# Patient Record
Sex: Male | Born: 2000
Health system: Southern US, Community
[De-identification: ages and names within clinical notes are randomized; demographics above are authoritative.]

## PROBLEM LIST (undated history)

## (undated) DIAGNOSIS — F419 Anxiety disorder, unspecified: Secondary | ICD-10-CM

## (undated) DIAGNOSIS — F32A Depression, unspecified: Secondary | ICD-10-CM

## (undated) DIAGNOSIS — J45909 Unspecified asthma, uncomplicated: Secondary | ICD-10-CM

## (undated) DIAGNOSIS — F329 Major depressive disorder, single episode, unspecified: Secondary | ICD-10-CM

## (undated) HISTORY — DX: Depression, unspecified: F32.A

## (undated) HISTORY — DX: Major depressive disorder, single episode, unspecified: F32.9

## (undated) HISTORY — PX: NO PAST SURGERIES: SHX2092

## (undated) HISTORY — PX: FRACTURE SURGERY: SHX138

---
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
## 2020-01-09 ENCOUNTER — Ambulatory Visit: Admitting: Family Medicine

## 2020-03-04 ENCOUNTER — Other Ambulatory Visit: Payer: Self-pay

## 2020-03-04 ENCOUNTER — Ambulatory Visit: Attending: Family | Admitting: Family

## 2020-03-04 ENCOUNTER — Encounter: Payer: Self-pay | Admitting: Family

## 2020-03-04 VITALS — BP 124/82 | HR 61 | Temp 98.2°F | Resp 16 | Wt 145.8 lb

## 2020-03-04 DIAGNOSIS — H938X1 Other specified disorders of right ear: Secondary | ICD-10-CM | POA: Diagnosis not present

## 2020-03-04 NOTE — Progress Notes (Signed)
Patient ID: Shawn [name], male    DOB: 05-Feb-2001  MRN: [id number]  CC: RIGHT EAR CONCERN  Subjective: Shawn [name] is a 19 y.o. male with history of asthma.  His concerns today include: right ear concern  1. RIGHT EAR CONCERN:  Location: right ear Description: not painful but does feel some fullness  Onset: 3 weeks ago while taking a shower and washing his hair reports water went into his right ear Modifying factors: tried hydrogen peroxide but didn't help also using Qtips to clean ears but did not help   Symptoms   Sensation of fullness: yes Ear discharge: denies URI symptoms:  denies Fever: denies Tinnitus:  denies Dizziness:  denies Headaches: denies Hearing loss:  Yes initially had decreased hearing. Reports on yesterday hearing improved some but not completely back to baseline. Sensation of foreign body in ear: denies Popping or clicking sounds: denies Toothache: denies Rashes or lesions: denies  Facial muscle weakness: denies  Red Flags Recent trauma: denies PMH prior ear surgery: denies   Diabetes or Immunosuppresion: denies     Patient Active Problem List   Diagnosis Date Noted  . Asthma 12/30/2019     Current Outpatient Medications on File Prior to Visit  Medication Sig Dispense Refill  . albuterol (PROVENTIL HFA;VENTOLIN HFA) 108 (90 BASE) MCG/ACT inhaler Inhale 2 puffs into the lungs every 6 (six) hours as needed for wheezing or shortness of breath.    Marland Kitchen albuterol (VENTOLIN HFA) 108 (90 Base) MCG/ACT inhaler Inhale 1-2 puffs into the lungs every 6 (six) hours as needed for wheezing or shortness of breath. 6.7 g 0  . fluticasone (FLONASE) 50 MCG/ACT nasal spray Place 1 spray into both nostrils daily as needed for allergies or rhinitis.    . fluticasone (FLOVENT HFA) 44 MCG/ACT inhaler Inhale 2 puffs into the lungs 2 (two) times daily. 1 Inhaler 3  . hydrOXYzine (ATARAX/VISTARIL) 10 MG tablet Take 1-2 up to 3 times/day as needed for anxiety (Patient not  taking: Reported on 12/28/2019) 90 tablet 1  . methocarbamol (ROBAXIN) 500 MG tablet Take 1 tablet (500 mg total) by mouth every 6 (six) hours as needed for muscle spasms. 30 tablet 2  . predniSONE (DELTASONE) 20 MG tablet Take 2 tablets (40 mg total) by mouth daily. (Patient not taking: Reported on 12/28/2019) 10 tablet 0  . venlafaxine XR (EFFEXOR-XR) 37.5 MG 24 hr capsule Take one each morning after breakfast for 1 week, then increase to 2 each morning (Patient not taking: Reported on 12/28/2019) 60 capsule 1   No current facility-administered medications on file prior to visit.    No Known Allergies  Social History   Socioeconomic History  . Marital status: Single    Spouse name: Not on file  . Number of children: Not on file  . Years of education: Not on file  . Highest education level: Not on file  Occupational History  . Not on file  Tobacco Use  . Smoking status: Never Smoker  . Smokeless tobacco: Never Used  Substance and Sexual Activity  . Alcohol use: No  . Drug use: No  . Sexual activity: Never  Other Topics Concern  . Not on file  Social History Narrative  . Not on file   Social Determinants of Health   Financial Resource Strain:   . Difficulty of Paying Living Expenses:   Food Insecurity:   . Worried About Programme researcher, broadcasting/film/video in the Last Year:   . [employer]  in the Last Year:   Transportation Needs:   . Film/video editor (Medical):   Marland Kitchen Lack of Transportation (Non-Medical):   Physical Activity:   . Days of Exercise per Week:   . Minutes of Exercise per Session:   Stress:   . Feeling of Stress :   Social Connections:   . Frequency of Communication with Friends and Family:   . Frequency of Social Gatherings with Friends and Family:   . Attends Religious Services:   . Active Member of Clubs or Organizations:   . Attends Archivist Meetings:   Marland Kitchen Marital Status:   Intimate Partner Violence:   . Fear of Current or Ex-Partner:   .  Emotionally Abused:   Marland Kitchen Physically Abused:   . Sexually Abused:     Family History  Problem Relation Age of Onset  . Asthma Mother   . Hypertension Father     Past Surgical History:  Procedure Laterality Date  . NO PAST SURGERIES      ROS: Review of Systems Negative except as stated above  PHYSICAL EXAM: Vitals with BMI 03/04/2020 12/28/2019 09/22/2019  Height - 6\' 2"  -  Weight 145 lbs 13 oz 148 lbs -  BMI - 95.09 -  Systolic 326 712 458  Diastolic 82 71 73  Pulse 61 77 62  Some encounter information is confidential and restricted. Go to Review Flowsheets activity to see all data.  SpO2- 97%, room air Temperature- 98.2 F  Physical Exam General appearance - alert, well appearing, and in no distress and oriented to person, place, and time Eyes - pupils equal and reactive, extraocular eye movements intact Ears - bilateral TM's and external ear canals normal Nose - normal and patent, no erythema, discharge or polyps and normal nontender sinuses Mouth - mucous membranes moist, pharynx normal without lesions Neck - supple, no significant adenopathy Lymphatics - no palpable lymphadenopathy, no hepatosplenomegaly Chest - clear to auscultation, no wheezes, rales or rhonchi, symmetric air entry, no tachypnea, retractions or cyanosis Heart - normal rate, regular rhythm, normal S1, S2, no murmurs, rubs, clicks or gallops Neurological - alert, oriented, normal speech, no focal findings or movement disorder noted, neck supple without rigidity, cranial nerves II through XII intact, funduscopic exam normal, discs flat and sharp, DTR's normal and symmetric, motor and sensory grossly normal bilaterally, normal muscle tone, no tremors, strength 5/5, Romberg sign negative, normal gait and station  CMP Latest Ref Rng & Units 04/12/2017 05/31/2016 02/28/2015  Glucose 65 - 99 mg/dL 164(H) 87 108(H)  BUN 6 - 20 mg/dL 8 6 6   Creatinine 0.50 - 1.00 mg/dL 1.05(H) 0.90 0.68  Sodium 135 - 145 mmol/L 135  141 140  Potassium 3.5 - 5.1 mmol/L 3.3(L) 3.8 4.1  Chloride 101 - 111 mmol/L 101 100(L) 106  CO2 22 - 32 mmol/L 23 - 26  Calcium 8.9 - 10.3 mg/dL 9.4 - 9.6  Total Protein 6.5 - 8.1 g/dL 7.1 - 7.4  Total Bilirubin 0.3 - 1.2 mg/dL 0.7 - 0.7  Alkaline Phos 74 - 390 U/L 129 - 351  AST 15 - 41 U/L 22 - 25  ALT 17 - 63 U/L 12(L) - 14   Lipid Panel  No results found for: CHOL, TRIG, HDL, CHOLHDL, VLDL, LDLCALC, LDLDIRECT  CBC    Component Value Date/Time   WBC 5.3 02/28/2015 1840   RBC 4.93 02/28/2015 1840   HGB 15.0 (H) 05/31/2016 1850   HCT 44.0 05/31/2016 1850   PLT 206  02/28/2015 1840   MCV 86.4 02/28/2015 1840   MCH 29.0 02/28/2015 1840   MCHC 33.6 02/28/2015 1840   RDW 12.8 02/28/2015 1840   LYMPHSABS 2.8 02/28/2015 1840   MONOABS 0.6 02/28/2015 1840   EOSABS 0.1 02/28/2015 1840   BASOSABS 0.0 02/28/2015 1840    ASSESSMENT AND PLAN: 1. Sensation of fullness in right ear: -Sensation of fullness most likely related to getting water in ear. Exam unrevealing. -Continue to monitor symptoms of right ear. If symptoms do not improve or become worse notify primary doctor. If symptoms become severe seek medical assistance immediately.  Patient was given the opportunity to ask questions.  Patient verbalized understanding of the plan and was able to repeat key elements of the plan.    Requested Prescriptions    No prescriptions requested or ordered in this encounter    Kaylenn Civil Jodi Geralds, NP

## 2020-03-04 NOTE — Patient Instructions (Signed)
Continue to monitor symptoms. Notify provider if symptoms worsen or do not improve. Seek emergency assistance if symptoms become severe. Earache, Adult An earache, or ear pain, can be caused by many things, including:  An infection.  Ear wax buildup.  Ear pressure.  Something in the ear that should not be there (foreign body).  A sore throat.  Tooth problems.  Jaw problems. Treatment of the earache will depend on the cause. If the cause is not clear or cannot be determined, you may need to watch your symptoms until your earache goes away or until a cause is found. Follow these instructions at home: Medicines  Take or apply over-the-counter and prescription medicines only as told by your health care provider.  If you were prescribed an antibiotic medicine, use it as told by your health care provider. Do not stop using the antibiotic even if you start to feel better.  Do not put anything in your ear other than medicine that is prescribed by your health care provider. Managing pain If directed, apply heat to the affected area as often as told by your health care provider. Use the heat source that your health care provider recommends, such as a moist heat pack or a heating pad.  Place a towel between your skin and the heat source.  Leave the heat on for 20-30 minutes.  Remove the heat if your skin turns bright red. This is especially important if you are unable to feel pain, heat, or cold. You may have a greater risk of getting burned. If directed, put ice on the affected area as often as told by your health care provider. To do this:      Put ice in a plastic bag.  Place a towel between your skin and the bag.  Leave the ice on for 20 minutes, 2-3 times a day. General instructions  Pay attention to any changes in your symptoms.  Try resting in an upright position instead of lying down. This may help to reduce pressure in your ear and relieve pain.  Chew gum if it helps  to relieve your ear pain.  Treat any allergies as told by your health care provider.  Drink enough fluid to keep your urine pale yellow.  It is up to you to get the results of any tests that were done. Ask your health care provider, or the department that is doing the tests, when your results will be ready.  Keep all follow-up visits as told by your health care provider. This is important. Contact a health care provider if:  Your pain does not improve within 2 days.  Your earache gets worse.  You have new symptoms.  You have a fever. Get help right away if you:  Have a severe headache.  Have a stiff neck.  Have trouble swallowing.  Have redness or swelling behind your ear.  Have fluid or blood coming from your ear.  Have hearing loss.  Feel dizzy. Summary  An earache, or ear pain, can be caused by many things.  Treatment of the earache will depend on the cause. Follow recommendations from your health care provider to treat your ear pain.  If the cause is not clear or cannot be determined, you may need to watch your symptoms until your earache goes away or until a cause is found.  Keep all follow-up visits as told by your health care provider. This is important. This information is not intended to replace advice given to you by  your health care provider. Make sure you discuss any questions you have with your health care provider. Document Revised: 06/23/2019 Document Reviewed: 06/23/2019 Elsevier Patient Education  [employer].

## 2020-06-18 ENCOUNTER — Telehealth: Payer: Self-pay

## 2020-06-18 ENCOUNTER — Ambulatory Visit: Payer: Self-pay | Admitting: *Deleted

## 2020-06-18 NOTE — Telephone Encounter (Signed)
Summary: Vaccine question   Patient's mother requesting to speak with RN to discuss whether to wait to get covid-19 vaccine before or after tooth extraction.      Second attempted to reach caller. Left message on voice mail to call back.

## 2020-06-18 NOTE — Telephone Encounter (Signed)
Summary: Vaccine question   Patient's mother requesting to speak with RN to discuss whether to wait to get covid-19 vaccine before or after tooth extraction.      Attempted to reach mother x 3. Unsuccessful.

## 2020-06-18 NOTE — Telephone Encounter (Signed)
Mom asking if pt. Can have COVID 19 vaccine prior to tooth extraction. Reports he is not sick, has not had any recent immunizations. Instructed her he should be fine to have vaccination.

## 2020-06-23 ENCOUNTER — Ambulatory Visit: Attending: Internal Medicine

## 2020-06-23 DIAGNOSIS — Z23 Encounter for immunization: Secondary | ICD-10-CM

## 2020-06-23 NOTE — Progress Notes (Signed)
   Covid-19 Vaccination Clinic  Name:  Shawn [name]    MRN: [id number] DOB: Apr 08, 2001  06/23/2020  Mr. [name] was observed post Covid-19 immunization for 15 minutes without incident. He was provided with Vaccine Information Sheet and instruction to access the V-Safe system.   Mr. [name] was instructed to call 911 with any severe reactions post vaccine: Marland Kitchen Difficulty breathing  . Swelling of face and throat  . A fast heartbeat  . A bad rash all over body  . Dizziness and weakness   Immunizations Administered    Name Date Dose VIS Date Route   Pfizer COVID-19 Vaccine 06/23/2020 11:24 AM 0.3 mL 01/23/2019 Intramuscular   Manufacturer: [employer], [employer]   Lot: [id number]   NDC: [address]-2

## 2020-07-14 ENCOUNTER — Ambulatory Visit: Attending: Internal Medicine

## 2020-07-14 DIAGNOSIS — Z23 Encounter for immunization: Secondary | ICD-10-CM

## 2020-07-14 NOTE — Progress Notes (Signed)
   Covid-19 Vaccination Clinic  Name:  Shawn [name]    MRN: [id number] DOB: Apr 24, 2001  07/14/2020  Mr. [name] was observed post Covid-19 immunization for 15 minutes without incident. He was provided with Vaccine Information Sheet and instruction to access the V-Safe system.   Mr. [name] was instructed to call 911 with any severe reactions post vaccine: Marland Kitchen Difficulty breathing  . Swelling of face and throat  . A fast heartbeat  . A bad rash all over body  . Dizziness and weakness   Immunizations Administered    Name Date Dose VIS Date Route   Pfizer COVID-19 Vaccine 07/14/2020 10:38 AM 0.3 mL 01/23/2019 Intramuscular   Manufacturer: [employer], [employer]   Lot: [id number]   NDC: [address]-9

## 2020-12-10 ENCOUNTER — Ambulatory Visit: Attending: Family Medicine | Admitting: Pharmacist

## 2020-12-10 ENCOUNTER — Other Ambulatory Visit: Payer: Self-pay

## 2020-12-10 ENCOUNTER — Ambulatory Visit: Attending: Physician Assistant | Admitting: Physician Assistant

## 2020-12-10 ENCOUNTER — Encounter: Payer: Self-pay | Admitting: Physician Assistant

## 2020-12-10 VITALS — BP 110/72 | HR 74 | Temp 98.2°F | Resp 16 | Ht 74.0 in | Wt 148.0 lb

## 2020-12-10 DIAGNOSIS — R251 Tremor, unspecified: Secondary | ICD-10-CM

## 2020-12-10 DIAGNOSIS — Z8659 Personal history of other mental and behavioral disorders: Secondary | ICD-10-CM | POA: Diagnosis not present

## 2020-12-10 DIAGNOSIS — M62838 Other muscle spasm: Secondary | ICD-10-CM

## 2020-12-10 DIAGNOSIS — Z23 Encounter for immunization: Secondary | ICD-10-CM

## 2020-12-10 MED ORDER — METHOCARBAMOL 500 MG PO TABS: 1000.0000 mg | ORAL_TABLET | Freq: Three times a day (TID) | ORAL | 2 refills | Status: DC | PRN

## 2020-12-10 MED ORDER — MELOXICAM 15 MG PO TABS: 15.0000 mg | ORAL_TABLET | Freq: Every day | ORAL | 0 refills | Status: DC

## 2020-12-10 NOTE — Progress Notes (Signed)
Patient ID: Shawn [name], male   DOB: [birth year], 20 y.o.   MRN: [id number]   Shawn [name], is a 20 y.o. male  VZD:[id number]  [id number]:[id number]  DOB - [birth year]  Subjective:  Chief Complaint and HPI: Shawn [name] is a 20 y.o. male here today for concerns that he wants to be referred to a neurologist for.  He has neck pain and muscle spasms that are ongoing for about 2 years.  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  No known injury.  Says occasionally he can't move either of his arms.  His says his therapist wants him to see a neurologist and he is set on this idea.  Also says he has had involuntary facial tics on and off for a couple of years.  Also gets shaky/has tremors in crowds.  He does not want to take anxiety meds but he has been prescribed them in the past.        ROS:   Constitutional:  No f/c, No night sweats, No unexplained weight loss. EENT:  No vision changes, No blurry vision, No hearing changes. No mouth, throat, or ear problems.  Respiratory: No cough, No SOB Cardiac: No CP, no palpitations GI:  No abd pain, No N/V/D. GU: No Urinary s/sx Musculoskeletal: see above Neuro: No headache, no dizziness, no motor weakness.  Skin: No rash Endocrine:  No polydipsia. No polyuria.  Psych: Denies SI/HI  No problems updated.  ALLERGIES: No Known Allergies  PAST MEDICAL HISTORY: Past Medical History:  Diagnosis Date  . Anxiety   . Asthma   . Depression     MEDICATIONS AT HOME: Prior to Admission medications   Medication Sig Start Date End Date Taking? Authorizing Provider  meloxicam (MOBIC) 15 MG tablet Take 1 tablet (15 mg total) by mouth daily. 12/10/20  Yes Antron Seth, Marzella Schlein, PA-C  albuterol (PROVENTIL HFA;VENTOLIN HFA) 108 (90 BASE) MCG/ACT inhaler Inhale 2 puffs into the lungs every 6 (six) hours as needed for wheezing or shortness of breath.    [provider]  albuterol (VENTOLIN HFA) 108 (90 Base) MCG/ACT inhaler  Inhale 1-2 puffs into the lungs every 6 (six) hours as needed for wheezing or shortness of breath. 09/22/19   Bethel Born, PA-C  fluticasone (FLONASE) 50 MCG/ACT nasal spray Place 1 spray into both nostrils daily as needed for allergies or rhinitis. Patient not taking: Reported on 12/10/2020    [provider]  fluticasone (FLOVENT HFA) 44 MCG/ACT inhaler Inhale 2 puffs into the lungs 2 (two) times daily. Patient not taking: Reported on 12/10/2020 12/31/19   Cain Saupe, MD  hydrOXYzine (ATARAX/VISTARIL) 10 MG tablet Take 1-2 up to 3 times/day as needed for anxiety Patient not taking: Reported on 12/28/2019 06/22/19   Gentry Fitz, MD  methocarbamol (ROBAXIN) 500 MG tablet Take 2 tablets (1,000 mg total) by mouth every 8 (eight) hours as needed for muscle spasms. 12/10/20   Anders Simmonds, PA-C  venlafaxine XR (EFFEXOR-XR) 37.5 MG 24 hr capsule Take one each morning after breakfast for 1 week, then increase to 2 each morning Patient not taking: Reported on 12/28/2019 06/22/19   Gentry Fitz, MD     Objective:  EXAM:   Vitals:   12/10/20 1134  BP: 110/72  Pulse: 74  Resp: 16  Temp: 98.2 F (36.8 C)  SpO2: 97%  Weight: 148 lb (67.1 kg)  Height: 6\' 2"  (1.88 m)    General appearance : A&OX3.  NAD. Non-toxic-appearing HEENT: Atraumatic and Normocephalic.  PERRLA. EOM intact.  Neck-no bony TTP; full S&ROM.  UE DTR=intact B.  There is spasm B in the upper trapezius Neck: supple, no JVD. No cervical lymphadenopathy. No thyromegaly Chest/Lungs:  Breathing-non-labored, Good air entry bilaterally, breath sounds normal without rales, rhonchi, or wheezing  CVS: S1 S2 regular, no murmurs, gallops, rubs  Extremities: Bilateral Lower Ext shows no edema, both legs are warm to touch with = pulse throughout Neurology:  CN II-XII grossly intact, Non focal.   Psych:  TP linear. J/I fair. Normal speech. Appropriate eye contact and blunted affect.  Skin:  No Rash  Data Review No results  found for: HGBA1C   Assessment & Plan   1. Muscle spasms of neck - methocarbamol (ROBAXIN) 500 MG tablet; Take 2 tablets (1,000 mg total) by mouth every 8 (eight) hours as needed for muscle spasms.  Dispense: 90 tablet; Refill: 2 - Ambulatory referral to Neurology - meloxicam (MOBIC) 15 MG tablet; Take 1 tablet (15 mg total) by mouth daily.  Dispense: 30 tablet; Refill: 0  2. H/O tics - Ambulatory referral to Neurology  3. Tremor - Ambulatory referral to Neurology   Patient have been counseled extensively about nutrition and exercise  Return in about 3 months (around 03/10/2021) for assign PCP.  The patient was given clear instructions to go to ER or return to medical center if symptoms don't improve, worsen or new problems develop. The patient verbalized understanding. The patient was told to call to get lab results if they haven't heard anything in the next week.     Georgian Co, PA-C [facility] [place], [place] [phone]   12/10/2020, 11:53 AM

## 2020-12-10 NOTE — Progress Notes (Signed)
Patient presents for vaccination against influenza per orders of Georgian Co. Consent given. Counseling provided. No contraindications exists. Vaccine administered without incident.   Fabio Neighbors, PharmD, BCPS PGY2 Ambulatory Care Resident [facility]

## 2020-12-19 ENCOUNTER — Other Ambulatory Visit

## 2020-12-19 DIAGNOSIS — Z20822 Contact with and (suspected) exposure to covid-19: Secondary | ICD-10-CM

## 2020-12-21 LAB — NOVEL CORONAVIRUS, NAA: SARS-CoV-2, NAA: NOT DETECTED

## 2020-12-21 LAB — SARS-COV-2, NAA 2 DAY TAT

## 2021-03-10 ENCOUNTER — Ambulatory Visit: Attending: Internal Medicine | Admitting: Internal Medicine

## 2021-03-10 ENCOUNTER — Other Ambulatory Visit: Payer: Self-pay

## 2021-03-10 DIAGNOSIS — J4520 Mild intermittent asthma, uncomplicated: Secondary | ICD-10-CM

## 2021-03-10 DIAGNOSIS — J302 Other seasonal allergic rhinitis: Secondary | ICD-10-CM

## 2021-03-10 MED ORDER — ALBUTEROL SULFATE HFA 108 (90 BASE) MCG/ACT IN AERS: 1.0000 | INHALATION_SPRAY | Freq: Four times a day (QID) | RESPIRATORY_TRACT | 5 refills | Status: DC | PRN

## 2021-03-10 MED ORDER — FLUTICASONE PROPIONATE 50 MCG/ACT NA SUSP: 1.0000 | Freq: Every day | NASAL | 3 refills | Status: DC | PRN

## 2021-03-10 NOTE — Progress Notes (Signed)
Virtual Visit via Telephone Note  I connected with Shawn [name] on 03/10/2021 at 3:40 p.m by telephone and verified that I am speaking with the correct person using two identifiers  Location: Patient: home Provider: office  Participants: Myself Patient CMA: Ms. Reginia Forts interpreter:   I discussed the limitations, risks, security and privacy concerns of performing an evaluation and management service by telephone and the availability of in person appointments. I also discussed with the patient that there may be a patient responsible charge related to this service. The patient expressed understanding and agreed to proceed.   History of Present Illness: She has history of mild intermittent asthma, neck spasm, GAD.  Last PCP is Dr. Jillyn Hidden who is no longer with the practice.  Asthma: Overall he reports he is doing well.  No complaints today.  No shortness of breath or cough.  Needing RF on Albuterol.  Only on this, not Flovent.  Uses albuterol 1-2 x/wk. Asthma flares during allergy season and uses Flonase to help reduce the symptoms.  Requests refills on this also.   Outpatient Encounter Medications as of 03/10/2021  Medication Sig  . albuterol (PROVENTIL HFA;VENTOLIN HFA) 108 (90 BASE) MCG/ACT inhaler Inhale 2 puffs into the lungs every 6 (six) hours as needed for wheezing or shortness of breath.  Marland Kitchen albuterol (VENTOLIN HFA) 108 (90 Base) MCG/ACT inhaler Inhale 1-2 puffs into the lungs every 6 (six) hours as needed for wheezing or shortness of breath.  . fluticasone (FLONASE) 50 MCG/ACT nasal spray Place 1 spray into both nostrils daily as needed for allergies or rhinitis. (Patient not taking: Reported on 12/10/2020)  . fluticasone (FLOVENT HFA) 44 MCG/ACT inhaler Inhale 2 puffs into the lungs 2 (two) times daily. (Patient not taking: Reported on 12/10/2020)  . hydrOXYzine (ATARAX/VISTARIL) 10 MG tablet Take 1-2 up to 3 times/day as needed for anxiety (Patient not taking: Reported on  12/28/2019)  . meloxicam (MOBIC) 15 MG tablet Take 1 tablet (15 mg total) by mouth daily.  . methocarbamol (ROBAXIN) 500 MG tablet Take 2 tablets (1,000 mg total) by mouth every 8 (eight) hours as needed for muscle spasms.  Marland Kitchen venlafaxine XR (EFFEXOR-XR) 37.5 MG 24 hr capsule Take one each morning after breakfast for 1 week, then increase to 2 each morning (Patient not taking: Reported on 12/28/2019)   No facility-administered encounter medications on file as of 03/10/2021.      Observations/Objective: No direct observation done as this was a telephone encounter.  Assessment and Plan: 1. Mild intermittent asthma without complication Stable.  Refill albuterol to use as needed. - albuterol (VENTOLIN HFA) 108 (90 Base) MCG/ACT inhaler; Inhale 1-2 puffs into the lungs every 6 (six) hours as needed for wheezing or shortness of breath.  Dispense: 6.7 g; Refill: 5  2. Seasonal allergies Refill Flonase to use as needed. - fluticasone (FLONASE) 50 MCG/ACT nasal spray; Place 1 spray into both nostrils daily as needed for allergies or rhinitis.  Dispense: 9.9 mL; Refill: 3   Follow Up Instructions: As needed   I discussed the assessment and treatment plan with the patient. The patient was provided an opportunity to ask questions and all were answered. The patient agreed with the plan and demonstrated an understanding of the instructions.   The patient was advised to call back or seek an in-person evaluation if the symptoms worsen or if the condition fails to improve as anticipated.  I  Spent 6 minutes on this telephone encounter  Jonah Blue, MD

## 2021-03-27 ENCOUNTER — Encounter: Payer: Self-pay | Admitting: Neurology

## 2021-03-27 ENCOUNTER — Ambulatory Visit (INDEPENDENT_AMBULATORY_CARE_PROVIDER_SITE_OTHER): Admitting: Neurology

## 2021-03-27 VITALS — BP 112/65 | HR 69 | Ht 74.0 in | Wt 142.0 lb

## 2021-03-27 DIAGNOSIS — F418 Other specified anxiety disorders: Secondary | ICD-10-CM

## 2021-03-27 DIAGNOSIS — G2569 Other tics of organic origin: Secondary | ICD-10-CM | POA: Insufficient documentation

## 2021-03-27 MED ORDER — ARIPIPRAZOLE 2 MG PO TABS: 4.0000 mg | ORAL_TABLET | Freq: Every day | ORAL | 6 refills | Status: DC

## 2021-03-27 NOTE — Progress Notes (Signed)
Chief Complaint  Patient presents with  . New Patient (Initial Visit)    New rm with father- Pt reports he is here to discuss muscle spasms of the neck, tremors and h/x of ticks. Also reports hx of anxiety. Reports the muscles spasm/ticks are triggered in large crowds.Ticks are located in his neck primarily-          ASSESSMENT AND PLAN  Shawn [name] is a 20 y.o. male   Depression anxiety Motor tics  Laboratory evaluation to rule out treatable etiology including thyroid functional test  Start Abilify 2 mg every night, may titrating to 4 mg every night   DIAGNOSTIC DATA (LABS, IMAGING, TESTING) - I reviewed patient records, labs, notes, testing and imaging myself where available. MRI of the brain without contrast was normal in 2017 Laboratory evaluation in May 2018, UDS was positive for marijuana, normal CMP,  HISTORICAL  Shawn [name] is a 20 year old male, seen in request by his PA Anders Simmonds, for evaluation of tics, initial evaluation was on March 27, 2021  I reviewed and summarized the referring note.  He reported long history of anxiety, began to seek treatment since high school, he reported social phobia, increased anxiety in front of people, heart beating fast, sweat, shaky, he tried to Zoloft 25 mg, Prozac 10 mg each only for short period of time, describes fatigue, lack of energy, no longer taking it  He is currently a Archivist, major in piano, since his senior year at high school, concurrent with his worsening depression anxiety, he began to have motor tics, frequent neck jerking movement, especially in crowds, he has to skip his high school graduation despite to being on the honor roll, because he worries about sitting in the crowd, working in the stage,  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, because he has frequent neck jerking movement in front of his professor, and in band practice, it is difficult for him to finish his performance and  practice  He sleeps well most of the time, has irregular eating habit, few days ago he had panic attack, driving through fast food, could not find his inhaler, began to have difficulty breathing, heart racing fast, he was able to manage driving back home, his anxiety panic episode gradually settled,  He is currently seeing psychotherapy,  Personally reviewed MRI of the brain in 2017 was normal, previous UDS was positive for marijuana  REVIEW OF SYSTEMS:  Full 14 system review of systems performed and notable only for as above All other review of systems were negative.  PHYSICAL EXAM:   Vitals:   03/27/21 1020  BP: 112/65  Pulse: 69  Weight: 142 lb (64.4 kg)  Height: 6\' 2"  (1.88 m)   Not recorded     Body mass index is 18.23 kg/m.  PHYSICAL EXAMNIATION:  Gen: NAD, conversant, well nourised, well groomed                     Cardiovascular: Regular rate rhythm, no peripheral edema, warm, nontender. Eyes: Conjunctivae clear without exudates or hemorrhage Neck: Supple, no carotid bruits. Pulmonary: Clear to auscultation bilaterally   NEUROLOGICAL EXAM:  MENTAL STATUS: Speech:    Speech is normal; fluent and spontaneous with normal comprehension.  Cognition:     Orientation to time, place and person     Normal recent and remote memory     Normal Attention span and concentration     Normal Language, naming, repeating,spontaneous speech  Fund of knowledge   CRANIAL NERVES: CN II: Visual fields are full to confrontation. Pupils are round equal and briskly reactive to light. CN III, IV, VI: extraocular movement are normal. No ptosis. CN V: Facial sensation is intact to light touch CN VII: Face is symmetric with normal eye closure  CN VIII: Hearing is normal to causal conversation. CN IX, X: Phonation is normal. CN XI: Head turning and shoulder shrug are intact  MOTOR: There is no pronator drift of out-stretched arms. Muscle bulk and tone are normal. Muscle strength  is normal.  REFLEXES: Reflexes are 2+ and symmetric at the biceps, triceps, knees, and ankles. Plantar responses are flexor.  SENSORY: Intact to light touch, pinprick and vibratory sensation are intact in fingers and toes.  COORDINATION: There is no trunk or limb dysmetria noted.  GAIT/STANCE: Posture is normal. Gait is steady with normal steps, base, arm swing, and turning. Heel and toe walking are normal. Tandem gait is normal.  Romberg is absent.  ALLERGIES: No Known Allergies  HOME MEDICATIONS: Current Outpatient Medications  Medication Sig Dispense Refill  . albuterol (VENTOLIN HFA) 108 (90 Base) MCG/ACT inhaler Inhale 1-2 puffs into the lungs every 6 (six) hours as needed for wheezing or shortness of breath. 6.7 g 5  . fluticasone (FLONASE) 50 MCG/ACT nasal spray Place 1 spray into both nostrils daily as needed for allergies or rhinitis. 9.9 mL 3  . fluticasone (FLOVENT HFA) 44 MCG/ACT inhaler Inhale 2 puffs into the lungs 2 (two) times daily. 1 Inhaler 3   No current facility-administered medications for this visit.    PAST MEDICAL HISTORY: Past Medical History:  Diagnosis Date  . Anxiety   . Asthma   . Depression     PAST SURGICAL HISTORY: Past Surgical History:  Procedure Laterality Date  . NO PAST SURGERIES      FAMILY HISTORY: Family History  Problem Relation Age of Onset  . Asthma Mother   . Hypertension Father     SOCIAL HISTORY: Social History   Socioeconomic History  . Marital status: Single    Spouse name: Not on file  . Number of children: Not on file  . Years of education: Not on file  . Highest education level: Not on file  Occupational History  . Not on file  Tobacco Use  . Smoking status: Never Smoker  . Smokeless tobacco: Never Used  Vaping Use  . Vaping Use: Never used  Substance and Sexual Activity  . Alcohol use: No  . Drug use: No  . Sexual activity: Never  Other Topics Concern  . Not on file  Social History Narrative   . Not on file   Social Determinants of Health   Financial Resource Strain: Not on file  Food Insecurity: Not on file  Transportation Needs: Not on file  Physical Activity: Not on file  Stress: Not on file  Social Connections: Not on file  Intimate Partner Violence: Not on file      Levert Feinstein, M.D. Ph.D.  [facility] [address] [place], [place] [address] Ph: [phone] Fax: [phone]  CC:  Anders Simmonds, PA-C [address] [place],  [place] [address]  Pcp, No

## 2021-03-28 LAB — CBC WITH DIFFERENTIAL
Basophils Absolute: 0 10*3/uL (ref 0.0–0.2)
Basos: 1 %
EOS (ABSOLUTE): 0.1 10*3/uL (ref 0.0–0.4)
Eos: 2 %
Hematocrit: 49.2 % (ref 37.5–51.0)
Hemoglobin: 16.7 g/dL (ref 13.0–17.7)
Immature Grans (Abs): 0 10*3/uL (ref 0.0–0.1)
Immature Granulocytes: 0 %
Lymphocytes Absolute: 1.7 10*3/uL (ref 0.7–3.1)
Lymphs: 38 %
MCH: 31.1 pg (ref 26.6–33.0)
MCHC: 33.9 g/dL (ref 31.5–35.7)
MCV: 92 fL (ref 79–97)
Monocytes Absolute: 0.6 10*3/uL (ref 0.1–0.9)
Monocytes: 13 %
Neutrophils Absolute: 2 10*3/uL (ref 1.4–7.0)
Neutrophils: 46 %
RBC: 5.37 x10E6/uL (ref 4.14–5.80)
RDW: 12 % (ref 11.6–15.4)
WBC: 4.4 10*3/uL (ref 3.4–10.8)

## 2021-03-28 LAB — COMPREHENSIVE METABOLIC PANEL
ALT: 12 IU/L (ref 0–44)
AST: 15 IU/L (ref 0–40)
Albumin/Globulin Ratio: 2 (ref 1.2–2.2)
Albumin: 4.9 g/dL (ref 4.1–5.2)
Alkaline Phosphatase: 75 IU/L (ref 51–125)
BUN/Creatinine Ratio: 12 (ref 9–20)
BUN: 12 mg/dL (ref 6–20)
Bilirubin Total: 0.9 mg/dL (ref 0.0–1.2)
CO2: 27 mmol/L (ref 20–29)
Calcium: 10.1 mg/dL (ref 8.7–10.2)
Chloride: 102 mmol/L (ref 96–106)
Creatinine, Ser: 1.04 mg/dL (ref 0.76–1.27)
Globulin, Total: 2.5 g/dL (ref 1.5–4.5)
Glucose: 79 mg/dL (ref 65–99)
Potassium: 4 mmol/L (ref 3.5–5.2)
Sodium: 141 mmol/L (ref 134–144)
Total Protein: 7.4 g/dL (ref 6.0–8.5)
eGFR: 106 mL/min/{1.73_m2} (ref >59)

## 2021-03-28 LAB — TSH: TSH: 1.11 u[IU]/mL (ref 0.450–4.500)

## 2021-06-08 ENCOUNTER — Ambulatory Visit: Attending: Internal Medicine

## 2021-06-08 ENCOUNTER — Other Ambulatory Visit: Payer: Self-pay

## 2021-06-08 DIAGNOSIS — Z23 Encounter for immunization: Secondary | ICD-10-CM

## 2021-06-25 ENCOUNTER — Other Ambulatory Visit: Payer: Self-pay | Admitting: Internal Medicine

## 2021-06-25 DIAGNOSIS — J4530 Mild persistent asthma, uncomplicated: Secondary | ICD-10-CM

## 2021-06-25 NOTE — Telephone Encounter (Signed)
  Notes to clinic:  Review for refill Change different that what was previously prescribed    Requested Prescriptions  Pending Prescriptions Disp Refills   FLOVENT HFA 110 MCG/ACT inhaler [Pharmacy Med Name: Flovent HFA 110 mcg/actuation aerosol inhaler] 12 g 6    Sig: inhale 1 puff with spacer daily when well, twice daily when sick      Pulmonology:  Corticosteroids Passed - 06/25/2021  1:35 PM      Passed - Valid encounter within last 12 months    Recent Outpatient Visits           3 months ago Mild intermittent asthma without complication   [facility] Marcine Matar, MD   6 months ago Need for influenza vaccination   [facility] Drucilla Chalet, RPH-CPP   6 months ago Muscle spasms of neck   [facility] [place], [place], [place]   1 year ago Sensation of fullness in right ear   [facility] [place], Virginia J, NP   1 year ago Mild intermittent asthma without complication   [facility] Cain Saupe, MD

## 2021-07-09 ENCOUNTER — Other Ambulatory Visit: Payer: Self-pay | Admitting: Internal Medicine

## 2021-07-09 DIAGNOSIS — J4530 Mild persistent asthma, uncomplicated: Secondary | ICD-10-CM

## 2021-07-09 NOTE — Telephone Encounter (Signed)
Requested medication (s) are due for refill today - yes  Requested medication (s) are on the active medication list -yes  Future visit scheduled -no  Last refill: 03/10/21 9.34ml 3RF  Notes to clinic: Duplicate request- denied 1 week ago- changes requested in the Rx. Requested 12g 6 RF- sent for provider review   Requested Prescriptions  Pending Prescriptions Disp Refills   FLOVENT HFA 110 MCG/ACT inhaler [Pharmacy Med Name: Flovent HFA 110 mcg/actuation aerosol inhaler] 12 g 6    Sig: inhale 1 puff with spacer daily when well, twice daily when sick     Pulmonology:  Corticosteroids Passed - 07/09/2021 10:05 AM      Passed - Valid encounter within last 12 months    Recent Outpatient Visits           4 months ago Mild intermittent asthma without complication   [facility] Marcine Matar, MD   7 months ago Need for influenza vaccination   [facility] Lois Huxley, Cornelius Moras, RPH-CPP   7 months ago Muscle spasms of neck   [facility] [place], [place], [place]   1 year ago Sensation of fullness in right ear   [facility] [place], Virginia J, NP   1 year ago Mild intermittent asthma without complication   [facility] [place], [place], [place]                 Requested Prescriptions  Pending Prescriptions Disp Refills   FLOVENT HFA 110 MCG/ACT inhaler [Pharmacy Med Name: Flovent HFA 110 mcg/actuation aerosol inhaler] 12 g 6    Sig: inhale 1 puff with spacer daily when well, twice daily when sick     Pulmonology:  Corticosteroids Passed - 07/09/2021 10:05 AM      Passed - Valid encounter within last 12 months    Recent Outpatient Visits           4 months ago Mild intermittent asthma without complication   [facility] Marcine Matar, MD   7 months ago Need for influenza vaccination   [facility] Lois Huxley, Cornelius Moras, RPH-CPP   7 months ago Muscle spasms of neck   [facility] [place], [place], [place]   1 year ago Sensation of fullness in right ear   [facility] [place], Virginia J, NP   1 year ago Mild intermittent asthma without complication   [facility] Cain Saupe, MD

## 2021-07-21 ENCOUNTER — Ambulatory Visit (INDEPENDENT_AMBULATORY_CARE_PROVIDER_SITE_OTHER): Admitting: Neurology

## 2021-07-21 ENCOUNTER — Encounter: Payer: Self-pay | Admitting: Neurology

## 2021-07-21 ENCOUNTER — Other Ambulatory Visit: Payer: Self-pay

## 2021-07-21 VITALS — BP 115/75 | HR 70 | Ht 74.0 in | Wt 140.0 lb

## 2021-07-21 DIAGNOSIS — G2569 Other tics of organic origin: Secondary | ICD-10-CM | POA: Diagnosis not present

## 2021-07-21 DIAGNOSIS — F418 Other specified anxiety disorders: Secondary | ICD-10-CM

## 2021-07-21 NOTE — Progress Notes (Signed)
Chief Complaint  Patient presents with   Follow-up    New room - alone. Follow up for motor tics/depression. His psychiatrist took him off Abilify and started him on Pristiq 25mg , one tab QHS. Feels this is working better for his depression. Motor tics are doing okay, worse w/ anxiety.       ASSESSMENT AND PLAN  Shawn [name] is a 20 y.o. male   Depression anxiety Motor tics  Laboratory evaluation showed normal CMP CBC TSH in April 2020  No significant improvement with Abilify, doing better on Pristiq 25 mg daily, is under the care of psychiatrist,  Continue follow-up with his primary care psychologist,  DIAGNOSTIC DATA (LABS, IMAGING, TESTING) - I reviewed patient records, labs, notes, testing and imaging myself where available. MRI of the brain without contrast was normal in 2017 Laboratory evaluation in May 2018, UDS was positive for marijuana, normal CMP, In April 2022, normal CBC, CMP, TSH   HISTORICAL  Shawn [name] is a 20 year old male, seen in request by his PA 12, for evaluation of tics, initial evaluation was on March 27, 2021  I reviewed and summarized the referring note.  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, 2021, major in piano, since his senior year at high school, concurrent with his worsening depression anxiety, he began to have motor tics, frequent neck jerking movement, especially in crowds, he has to skip his high school graduation despite to being on the honor roll, because he worries about sitting in the crowd, working in the stage,  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, because he has frequent neck jerking movement in front of his  professor, and in band practice, it is difficult for him to finish his performance and practice  He sleeps well most of the time, has irregular eating habit, few days ago he had panic attack, driving through fast food, could not find his inhaler, began to have difficulty breathing, heart racing fast, he was able to manage driving back home, his anxiety panic episode gradually settled,  He is currently seeing psychotherapy,  Personally reviewed MRI of the brain in 2017 was normal, previous UDS was positive for marijuana  UPDATE July 21 2021: He has been on Pristiq 25mg  since June 2022, helps better than abilify.  Pristiq has helped his mood, he is more active, adjusting to his schedule better.   He has less fatigue, he started school, majored in performance,  REVIEW OF SYSTEMS:  Full 14 system review of systems performed and notable only for as above All other review of systems were negative.  PHYSICAL EXAM:   Vitals:   07/21/21 0718  BP: 115/75  Pulse: 70  Weight: 140 lb (63.5 kg)  Height: 6\' 2"  (1.88 m)   Not recorded     Body mass index is 17.97 kg/m.  PHYSICAL EXAMNIATION:  Gen: NAD, conversant, well nourised, well groomed                      NEUROLOGICAL EXAM:  MENTAL STATUS: Speech/cognition: Awake, alert, oriented to history taking care of conversation   CRANIAL NERVES: CN II: Visual fields are full to confrontation. Pupils are  round equal and briskly reactive to light. CN III, IV, VI: extraocular movement are normal. No ptosis. CN V: Facial sensation is intact to light touch CN VII: Face is symmetric with normal eye closure  CN VIII: Hearing is normal to causal conversation. CN IX, X: Phonation is normal. CN XI: Head turning and shoulder shrug are intact  MOTOR: Normal tone bulk and strength  REFLEXES: Reflexes are 2+ and symmetric at the biceps, triceps, knees, and ankles. Plantar responses are flexor.  SENSORY: Intact to light touch, pinprick and  vibratory sensation are intact in fingers and toes.  COORDINATION: There is no trunk or limb dysmetria noted.  GAIT/STANCE: Posture is normal.  Gait is normal and steady  ALLERGIES: No Known Allergies  HOME MEDICATIONS: Current Outpatient Medications  Medication Sig Dispense Refill   albuterol (VENTOLIN HFA) 108 (90 Base) MCG/ACT inhaler Inhale 1-2 puffs into the lungs every 6 (six) hours as needed for wheezing or shortness of breath. 6.7 g 5   Desvenlafaxine Succinate ER 25 MG TB24 Take 1 tablet by mouth at bedtime.     FLOVENT HFA 110 MCG/ACT inhaler inhale 1 puff with spacer daily when well, twice daily when sick 12 g 6   fluticasone (FLONASE) 50 MCG/ACT nasal spray Place 1 spray into both nostrils daily as needed for allergies or rhinitis. 9.9 mL 3   No current facility-administered medications for this visit.    PAST MEDICAL HISTORY: Past Medical History:  Diagnosis Date   Anxiety    Asthma    Depression     PAST SURGICAL HISTORY: Past Surgical History:  Procedure Laterality Date   NO PAST SURGERIES      FAMILY HISTORY: Family History  Problem Relation Age of Onset   Asthma Mother    Hypertension Father     SOCIAL HISTORY: Social History   Socioeconomic History   Marital status: Single    Spouse name: Not on file   Number of children: Not on file   Years of education: Not on file   Highest education level: Not on file  Occupational History   Not on file  Tobacco Use   Smoking status: Never   Smokeless tobacco: Never  Vaping Use   Vaping Use: Never used  Substance and Sexual Activity   Alcohol use: No   Drug use: No   Sexual activity: Never  Other Topics Concern   Not on file  Social History Narrative   Not on file   Social Determinants of Health   Financial Resource Strain: Not on file  Food Insecurity: Not on file  Transportation Needs: Not on file  Physical Activity: Not on file  Stress: Not on file  Social Connections: Not on file   Intimate Partner Violence: Not on file      Levert Feinstein, M.D. Ph.D.  [facility] [address], [address] [place], [place] [address] Ph: [phone] Fax: [phone]  CC:  No referring provider defined for this encounter.  Pcp, No

## 2021-08-19 IMAGING — DX DG CHEST 1V PORT
2 series · 2 of 2 positions shown · non-contrast
Comparison: None.

CLINICAL DATA: Chest tightness, shortness of breath

EXAM:
PORTABLE CHEST 1 VIEW

[chest ap (1 of 2)]
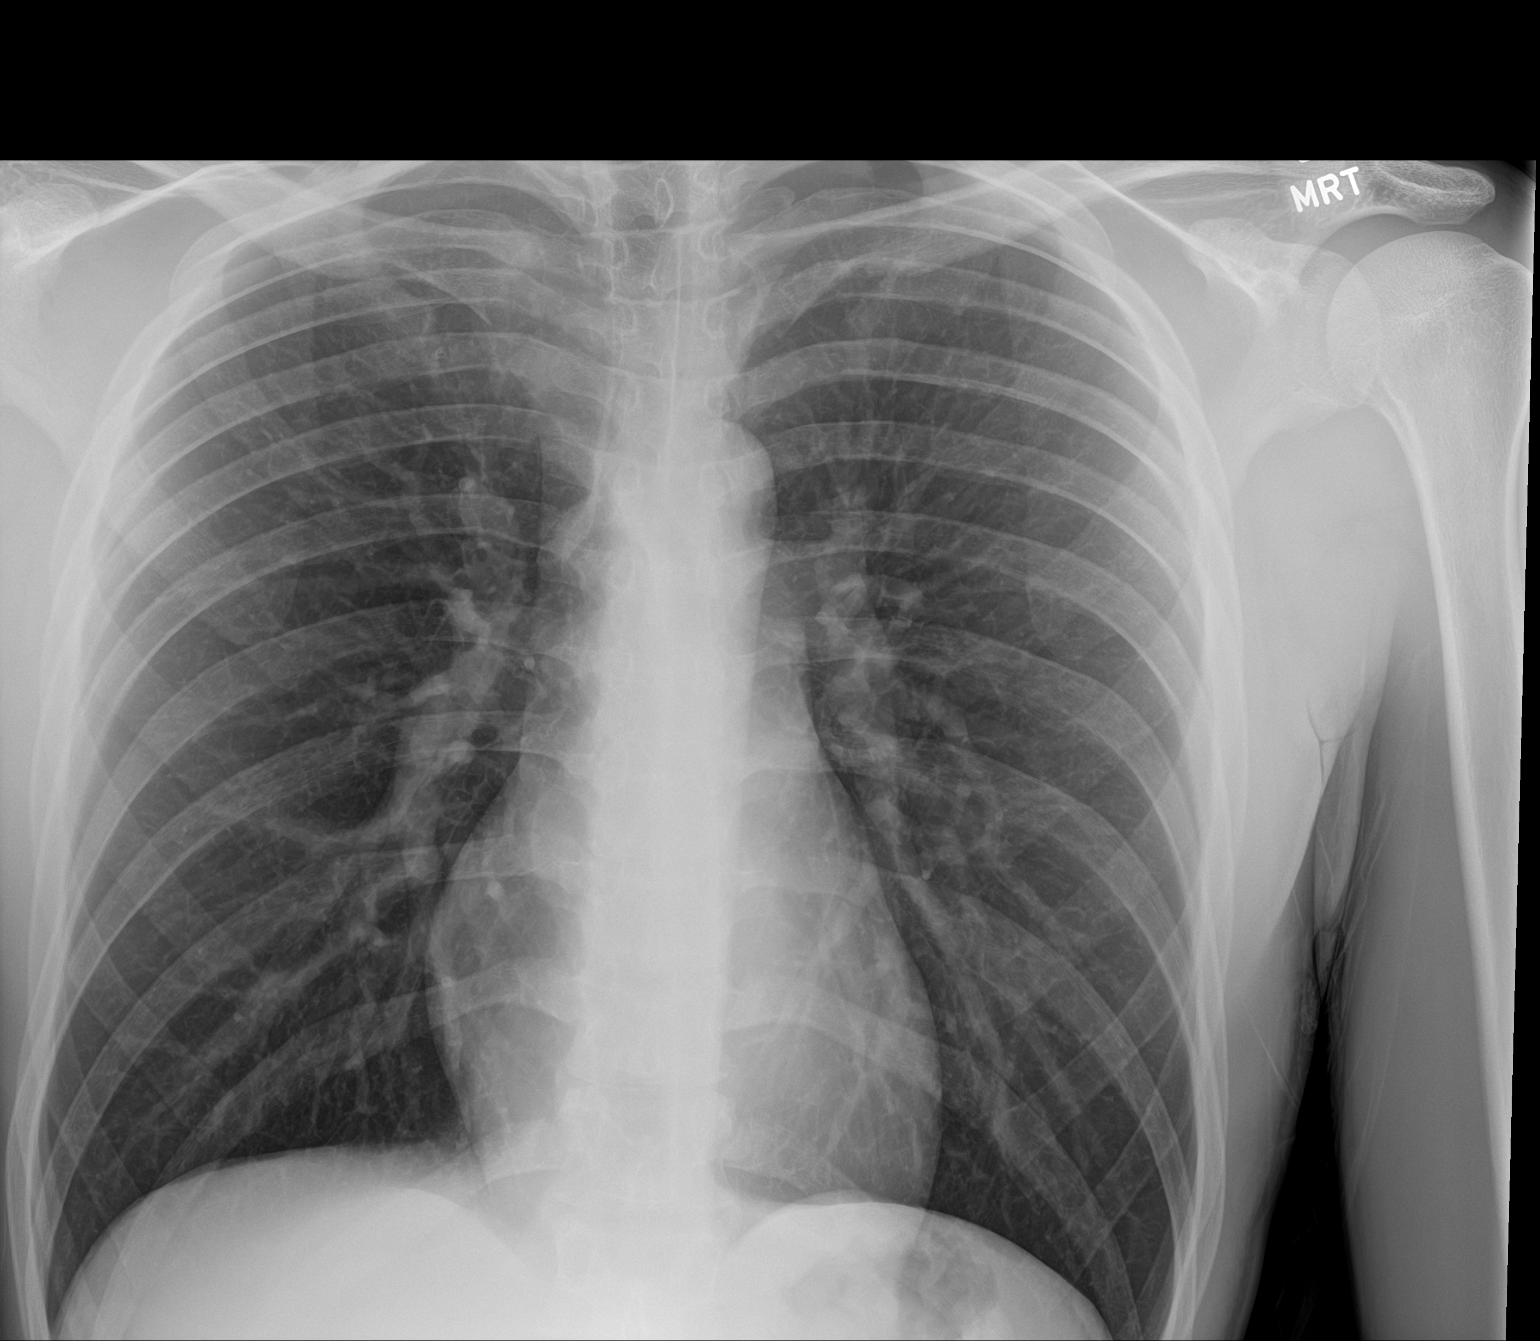

[chest ap (2 of 2)]
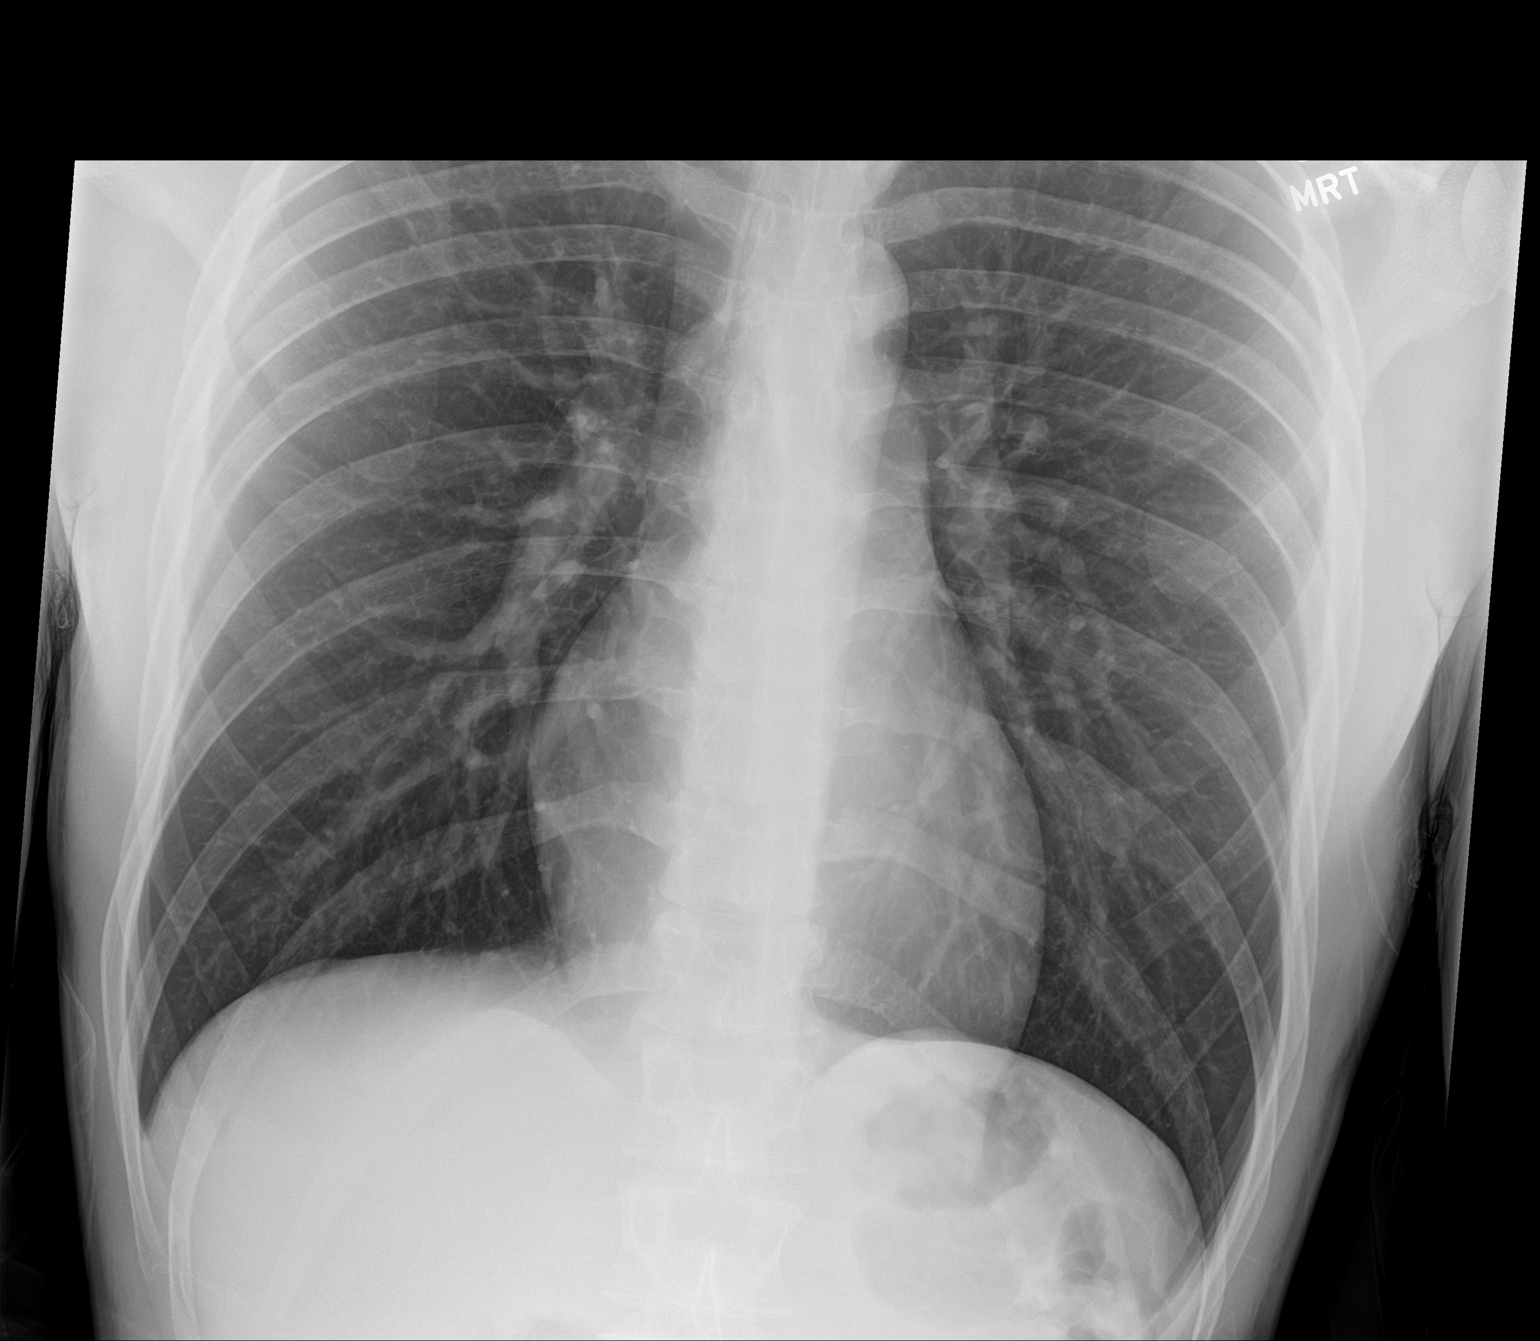

[2 of 2 positions shown; findings below may reference images not displayed]

FINDINGS: The heart size and mediastinal contours are within normal limits.
Both lungs are clear. The visualized skeletal structures are
unremarkable.
IMPRESSION: No acute abnormality of the lungs in AP portable projection.

## 2021-09-28 ENCOUNTER — Other Ambulatory Visit (HOSPITAL_BASED_OUTPATIENT_CLINIC_OR_DEPARTMENT_OTHER): Payer: Self-pay

## 2021-12-13 ENCOUNTER — Emergency Department (HOSPITAL_COMMUNITY)
Admission: EM | Admit: 2021-12-13 | Discharge: 2021-12-13 | Disposition: A | Discharge status: Home / Self Care | Attending: Emergency Medicine | Admitting: Emergency Medicine

## 2021-12-13 ENCOUNTER — Encounter (HOSPITAL_COMMUNITY): Payer: Self-pay

## 2021-12-13 ENCOUNTER — Other Ambulatory Visit: Payer: Self-pay

## 2021-12-13 DIAGNOSIS — J4530 Mild persistent asthma, uncomplicated: Secondary | ICD-10-CM

## 2021-12-13 DIAGNOSIS — Z7951 Long term (current) use of inhaled steroids: Secondary | ICD-10-CM | POA: Insufficient documentation

## 2021-12-13 DIAGNOSIS — J45909 Unspecified asthma, uncomplicated: Secondary | ICD-10-CM | POA: Insufficient documentation

## 2021-12-13 DIAGNOSIS — R0789 Other chest pain: Secondary | ICD-10-CM | POA: Insufficient documentation

## 2021-12-13 DIAGNOSIS — Z79899 Other long term (current) drug therapy: Secondary | ICD-10-CM | POA: Insufficient documentation

## 2021-12-13 DIAGNOSIS — J4520 Mild intermittent asthma, uncomplicated: Secondary | ICD-10-CM

## 2021-12-13 MED ORDER — ALBUTEROL SULFATE HFA 108 (90 BASE) MCG/ACT IN AERS
2.0000 | INHALATION_SPRAY | Freq: Once | RESPIRATORY_TRACT | Status: AC
  Administered 2021-12-13: 2 via RESPIRATORY_TRACT
  Filled 2021-12-13: qty 6.7

## 2021-12-13 MED ORDER — ALBUTEROL SULFATE HFA 108 (90 BASE) MCG/ACT IN AERS: 1.0000 | INHALATION_SPRAY | Freq: Four times a day (QID) | RESPIRATORY_TRACT | 1 refills | Status: DC | PRN

## 2021-12-13 MED ORDER — FLUTICASONE PROPIONATE HFA 110 MCG/ACT IN AERO: INHALATION_SPRAY | RESPIRATORY_TRACT | 0 refills | Status: DC

## 2021-12-13 NOTE — ED Triage Notes (Addendum)
Patient has asthma and lost his inhaler. He needs another one because he said he does not like to go anywhere without it. No shortness of breath, no chest pain.

## 2021-12-13 NOTE — ED Provider Notes (Signed)
[facility]-EMERGENCY DEPT Provider Note   CSN: [id number] Arrival date & time: 12/13/21  0059     History  Chief Complaint  Patient presents with   Asthma   Needs Inhaler    Durrell Barajas is a 21 y.o. male.  21 year old male presents to the emergency department for complaints of mild chest tightness.  Reports that he recently lost his rescue inhaler.  This usually helps to alleviate his symptoms.  Presently does not have any complaints of shortness of breath or chest pain.  No recent fevers.  Is also supposed to be taking daily Flovent, but has not had this prescription in quite a while.  The history is provided by the patient. No language interpreter was used.  Asthma      Home Medications Prior to Admission medications   Medication Sig Start Date End Date Taking? Authorizing Provider  albuterol (VENTOLIN HFA) 108 (90 Base) MCG/ACT inhaler Inhale 1-2 puffs into the lungs every 6 (six) hours as needed for wheezing or shortness of breath. 12/13/21   Antony Madura, PA-C  Desvenlafaxine Succinate ER 25 MG TB24 Take 1 tablet by mouth at bedtime. 07/06/21   [provider]  fluticasone (FLONASE) 50 MCG/ACT nasal spray Place 1 spray into both nostrils daily as needed for allergies or rhinitis. 03/10/21   Marcine Matar, MD  fluticasone (FLOVENT HFA) 110 MCG/ACT inhaler inhale 1 puff with spacer daily when well, twice daily when sick 12/13/21   Antony Madura, PA-C      Allergies    Patient has no known allergies.    Review of Systems   Review of Systems Ten systems reviewed and are negative for acute change, except as noted in the HPI.    Physical Exam Updated Vital Signs BP 120/78 (BP Location: Right Arm)    Pulse 65    Temp 98.1 F (36.7 C) (Oral)    Resp 19    Ht 6\' 2"  (1.88 m)    Wt 64 kg    SpO2 98%    BMI 18.12 kg/m  Physical Exam Vitals and nursing note reviewed.  Constitutional:      General: He is not in acute distress.    Appearance:  He is well-developed. He is not diaphoretic.     Comments: Nontoxic-appearing and in no acute distress  HENT:     Head: Normocephalic and atraumatic.  Eyes:     General: No scleral icterus.    Conjunctiva/sclera: Conjunctivae normal.  Cardiovascular:     Rate and Rhythm: Normal rate and regular rhythm.     Pulses: Normal pulses.  Pulmonary:     Effort: Pulmonary effort is normal. No respiratory distress.     Breath sounds: No stridor. No wheezing, rhonchi or rales.     Comments: Lungs clear to auscultation bilaterally.  Respirations even and unlabored. Musculoskeletal:        General: Normal range of motion.     Cervical back: Normal range of motion.  Skin:    General: Skin is warm and dry.     Coloration: Skin is not pale.     Findings: No erythema or rash.  Neurological:     Mental Status: He is alert and oriented to person, place, and time.  Psychiatric:        Behavior: Behavior normal.    ED Results / Procedures / Treatments   Labs (all labs ordered are listed, but only abnormal results are displayed) Labs Reviewed - No data to  display  EKG None  Radiology No results found.  Procedures Procedures    Medications Ordered in ED Medications  albuterol (VENTOLIN HFA) 108 (90 Base) MCG/ACT inhaler 2 puff (has no administration in time range)    ED Course/ Medical Decision Making/ A&P                           Medical Decision Making  21 year old male presenting to the emergency department, essentially, for medication refill.  Noting some mild chest tightness, though lungs are clear to auscultation.  He has no tachypnea, dyspnea, hypoxia.  Given albuterol inhaler in the department and refills of his Flovent and albuterol have been sent to his pharmacy. Return precautions discussed and provided. Patient discharged in stable condition with no unaddressed concerns.        Final Clinical Impression(s) / ED Diagnoses Final diagnoses:  Chest tightness    Rx /  DC Orders ED Discharge Orders          Ordered    albuterol (VENTOLIN HFA) 108 (90 Base) MCG/ACT inhaler  Every 6 hours PRN        12/13/21 0124    fluticasone (FLOVENT HFA) 110 MCG/ACT inhaler        12/13/21 0124              Antony Madura, PA-C 12/13/21 0128    Sabas Sous, MD 12/13/21 [phone]

## 2022-03-17 ENCOUNTER — Other Ambulatory Visit: Payer: Self-pay | Admitting: Internal Medicine

## 2022-03-17 DIAGNOSIS — J302 Other seasonal allergic rhinitis: Secondary | ICD-10-CM

## 2022-06-14 ENCOUNTER — Ambulatory Visit: Payer: Self-pay | Admitting: *Deleted

## 2022-06-14 NOTE — Telephone Encounter (Signed)
Summary: Numbness in finger   Pt's right middle finger is numb, he is a Dance movement psychotherapist in college. Going on for 2 weeks  Best contact: [phone]        Chief Complaint: numbness in right middle fingertip Symptoms: numbness constant in right middle fingertip. Not radiating in hand or arm. Only c/o Frequency: 2 weeks  Pertinent Negatives: Patient denies numbness or tingling any where else in body no chest pain no headaches.  Disposition: [] ED /[] Urgent Care (no appt availability in office) / [x] Appointment(In office/virtual)/ []  [facility]/ [] Home Care/ [] Refused Recommended Disposition /[] [facility]/ []  Follow-up with PCP Additional Notes:   Appt scheduled 06/24/22.      Reason for Disposition  [1] Numbness or tingling in one or both hands AND [2] is a chronic symptom (recurrent or ongoing AND present > 4 weeks)  Protocols used: Neurologic Deficit-A-AH

## 2022-06-24 ENCOUNTER — Ambulatory Visit: Admitting: Physician Assistant

## 2022-08-10 ENCOUNTER — Telehealth: Payer: Self-pay | Admitting: Emergency Medicine

## 2022-08-10 NOTE — Telephone Encounter (Signed)
Copied from CRM [id number]. Topic: General - Other >> Aug 09, 2022 11:15 AM Esperanza Sheets wrote: Caller states fluticasone (FLOVENT HFA) 110 MCG/ACT inhaler out of pocket cost is too expensive and requesting an affordable alternative  Advised caller to inquire w/ insurance which alternative is covered  Caller will call back w/ information but would still like for provider to prescribe alternative

## 2022-08-11 NOTE — Telephone Encounter (Signed)
Noted. Will wait for pt to RTC. Will f/u w/ in a few days to see if insurance was contacted for alternative for pcp to RF---DD,RMA

## 2022-08-13 NOTE — Telephone Encounter (Signed)
Pt has [employer] insurance can we switch to preferred inhaler.

## 2022-08-13 NOTE — Telephone Encounter (Signed)
Patient hasn't been seen in > 1 year. Approval will have to come from PCP. I recommend Proventil (written with a quantity of 6.7g) if PCP approves.

## 2022-08-16 MED ORDER — ALBUTEROL SULFATE HFA 108 (90 BASE) MCG/ACT IN AERS: 2.0000 | INHALATION_SPRAY | Freq: Four times a day (QID) | RESPIRATORY_TRACT | 0 refills | Status: DC | PRN

## 2022-08-16 NOTE — Telephone Encounter (Signed)
Prescription has been sent to pharmacy.

## 2022-08-16 NOTE — Telephone Encounter (Signed)
Routing to PCP for review.

## 2022-08-16 NOTE — Addendum Note (Signed)
Addended by: Charlott Rakes on: 08/16/2022 05:37 PM   Modules accepted: Orders

## 2022-08-17 NOTE — Telephone Encounter (Signed)
Pt was called and informed of medication being sent to pharmacy. 

## 2022-08-23 ENCOUNTER — Other Ambulatory Visit: Payer: Self-pay | Admitting: Internal Medicine

## 2022-08-23 DIAGNOSIS — J302 Other seasonal allergic rhinitis: Secondary | ICD-10-CM

## 2022-08-24 NOTE — Telephone Encounter (Signed)
Requested medication (s) are due for refill today: yes  Requested medication (s) are on the active medication list: yes  Last refill:  03/10/21   Future visit scheduled: yes  Notes to clinic:  CPE scheduled - last OV 03/10/21   Requested Prescriptions  Pending Prescriptions Disp Refills   fluticasone (FLONASE) 50 MCG/ACT nasal spray [Pharmacy Med Name: fluticasone propionate 50 mcg/actuation nasal spray,suspension] 16 g 3    Sig: PLACE 1 SPRAY IN EACH NOSTRIL DAILY AS NEEDED FOR allergies OR RHINITIS     Ear, Nose, and Throat: Nasal Preparations - Corticosteroids Failed - 08/23/2022  9:18 AM      Failed - Valid encounter within last 12 months    Recent Outpatient Visits           1 year ago Mild intermittent asthma without complication   [place] Ladell [place], MD   1 year ago Need for influenza vaccination   [place], R[facility]   1 year ago Muscle spasms of neck   [facility] [place], [place], [place]   2 years ago Sensation of fullness in right ear   [place], [place], NP   2 years ago Mild intermittent asthma without complication   [place], MD       Future Appointments             In 1 month Wynetta Emery, Dalbert Batman, MD [place]

## 2022-09-23 ENCOUNTER — Encounter: Admitting: Internal Medicine

## 2022-10-11 ENCOUNTER — Ambulatory Visit (INDEPENDENT_AMBULATORY_CARE_PROVIDER_SITE_OTHER): Admitting: Nurse Practitioner

## 2022-10-11 ENCOUNTER — Encounter: Payer: Self-pay | Admitting: Nurse Practitioner

## 2022-10-11 VITALS — BP 102/67 | HR 69 | Temp 97.9°F | Ht 74.0 in | Wt 140.0 lb

## 2022-10-11 DIAGNOSIS — Z0000 Encounter for general adult medical examination without abnormal findings: Secondary | ICD-10-CM | POA: Diagnosis not present

## 2022-10-11 DIAGNOSIS — Z13220 Encounter for screening for lipoid disorders: Secondary | ICD-10-CM | POA: Diagnosis not present

## 2022-10-11 DIAGNOSIS — Z23 Encounter for immunization: Secondary | ICD-10-CM | POA: Diagnosis not present

## 2022-10-11 DIAGNOSIS — Z1329 Encounter for screening for other suspected endocrine disorder: Secondary | ICD-10-CM

## 2022-10-11 DIAGNOSIS — J302 Other seasonal allergic rhinitis: Secondary | ICD-10-CM | POA: Diagnosis not present

## 2022-10-11 MED ORDER — FLUTICASONE PROPIONATE 50 MCG/ACT NA SUSP: NASAL | 0 refills | Status: DC

## 2022-10-11 NOTE — Patient Instructions (Signed)
1. Seasonal allergies  - fluticasone (FLONASE) 50 MCG/ACT nasal spray; PLACE 1 SPRAY IN EACH NOSTRIL DAILY AS NEEDED FOR allergies OR RHINITIS  Dispense: 16 g; Refill: 0  2. Routine adult health maintenance  - CBC - Comprehensive metabolic panel - Lipid Panel - TSH  3. Thyroid disorder screen  - TSH  4. Lipid screening  - Lipid Panel   Follow up:  Follow up in 1 year or sooner if needed

## 2022-10-11 NOTE — Assessment & Plan Note (Signed)
-   fluticasone (FLONASE) 50 MCG/ACT nasal spray; PLACE 1 SPRAY IN EACH NOSTRIL DAILY AS NEEDED FOR allergies OR RHINITIS  Dispense: 16 g; Refill: 0  2. Routine adult health maintenance  - CBC - Comprehensive metabolic panel - Lipid Panel - TSH  3. Thyroid disorder screen  - TSH  4. Lipid screening  - Lipid Panel   Follow up:  Follow up in 1 year or sooner if needed

## 2022-10-11 NOTE — Progress Notes (Signed)
@Patient  ID: , male    DOB: October 29, 2001, 21 y.o.   MRN: [id number]  Chief Complaint  Patient presents with   Annual Exam    Referring provider: No ref. provider found   HPI   Patient presents today for a physical.  He does have a history of asthma and anxiety and depression.  He is establishing care here today.  He is not currently on any prescription medications other than inhalers.  He is requesting Flonase to be sent to the pharmacy. Denies f/c/s, n/v/d, hemoptysis, PND, leg swelling Denies chest pain or edema        No Known Allergies  Immunization History  Administered Date(s) Administered   Influenza,inj,Quad PF,6+ Mos 12/10/2020, 10/11/2022   PFIZER Comirnaty(Gray Top)Covid-19 Tri-Sucrose Vaccine 06/08/2021   PFIZER(Purple Top)SARS-COV-2 Vaccination 06/23/2020, 07/14/2020    Past Medical History:  Diagnosis Date   Anxiety    Asthma    Asthma    Depression     Tobacco History: Social History   Tobacco Use  Smoking Status Never  Smokeless Tobacco Never   Counseling given: Not Answered   Outpatient Encounter Medications as of 10/11/2022  Medication Sig   albuterol (VENTOLIN HFA) 108 (90 Base) MCG/ACT inhaler Inhale 1-2 puffs into the lungs every 6 (six) hours as needed for wheezing or shortness of breath.   albuterol (VENTOLIN HFA) 108 (90 Base) MCG/ACT inhaler Inhale 2 puffs into the lungs every 6 (six) hours as needed for wheezing or shortness of breath.   fluticasone (FLOVENT HFA) 110 MCG/ACT inhaler inhale 1 puff with spacer daily when well, twice daily when sick   [DISCONTINUED] fluticasone (FLONASE) 50 MCG/ACT nasal spray PLACE 1 SPRAY IN EACH NOSTRIL DAILY AS NEEDED FOR allergies OR RHINITIS   fluticasone (FLONASE) 50 MCG/ACT nasal spray PLACE 1 SPRAY IN EACH NOSTRIL DAILY AS NEEDED FOR allergies OR RHINITIS   [DISCONTINUED] Desvenlafaxine Succinate ER 25 MG TB24 Take 1 tablet by mouth at bedtime.   No facility-administered  encounter medications on file as of 10/11/2022.     Review of Systems  Review of Systems  Constitutional: Negative.   HENT: Negative.    Cardiovascular: Negative.   Gastrointestinal: Negative.   Allergic/Immunologic: Negative.   Neurological: Negative.   Psychiatric/Behavioral: Negative.         Physical Exam  BP 102/67   Pulse 69   Temp 97.9 F (36.6 C)   Ht 6\' 2"  (1.88 m)   Wt 140 lb (63.5 kg)   SpO2 97%   BMI 17.97 kg/m   Wt Readings from Last 5 Encounters:  10/11/22 140 lb (63.5 kg)  12/13/21 141 lb 1.5 oz (64 kg)  07/21/21 140 lb (63.5 kg)  03/27/21 142 lb (64.4 kg) (28 %, Z= -0.58)*  12/10/20 148 lb (67.1 kg) (39 %, Z= -0.27)*   * Growth percentiles are based on CDC (Boys, 2-20 Years) data.     Physical Exam Vitals and nursing note reviewed.  Constitutional:      General: He is not in acute distress.    Appearance: He is well-developed.  Cardiovascular:     Rate and Rhythm: Normal rate and regular rhythm.  Pulmonary:     Effort: Pulmonary effort is normal.     Breath sounds: Normal breath sounds.  Skin:    General: Skin is warm and dry.  Neurological:     Mental Status: He is alert and oriented to person, place, and time.      Lab Results:  CBC  Component Value Date/Time   WBC 4.4 03/27/2021 1057   WBC 5.3 02/28/2015 1840   RBC 5.37 03/27/2021 1057   RBC 4.93 02/28/2015 1840   HGB 16.7 03/27/2021 1057   HCT 49.2 03/27/2021 1057   PLT 206 02/28/2015 1840   MCV 92 03/27/2021 1057   MCH 31.1 03/27/2021 1057   MCH 29.0 02/28/2015 1840   MCHC 33.9 03/27/2021 1057   MCHC 33.6 02/28/2015 1840   RDW 12.0 03/27/2021 1057   LYMPHSABS 1.7 03/27/2021 1057   MONOABS 0.6 02/28/2015 1840   EOSABS 0.1 03/27/2021 1057   BASOSABS 0.0 03/27/2021 1057    BMET    Component Value Date/Time   NA 141 03/27/2021 1057   K 4.0 03/27/2021 1057   CL 102 03/27/2021 1057   CO2 27 03/27/2021 1057   GLUCOSE 79 03/27/2021 1057   GLUCOSE 164 (H)  04/12/2017 1910   BUN 12 03/27/2021 1057   CREATININE 1.04 03/27/2021 1057   CALCIUM 10.1 03/27/2021 1057   GFRNONAA NOT CALCULATED 04/12/2017 1910   GFRAA NOT CALCULATED 04/12/2017 1910      Assessment & Plan:   Seasonal allergies - fluticasone (FLONASE) 50 MCG/ACT nasal spray; PLACE 1 SPRAY IN EACH NOSTRIL DAILY AS NEEDED FOR allergies OR RHINITIS  Dispense: 16 g; Refill: 0  2. Routine adult health maintenance  - CBC - Comprehensive metabolic panel - Lipid Panel - TSH  3. Thyroid disorder screen  - TSH  4. Lipid screening  - Lipid Panel   Follow up:  Follow up in 1 year or sooner if needed     Ivonne Andrew, NP 10/11/2022

## 2022-10-12 LAB — COMPREHENSIVE METABOLIC PANEL
ALT: 18 IU/L (ref 0–44)
AST: 19 IU/L (ref 0–40)
Albumin/Globulin Ratio: 1.9 (ref 1.2–2.2)
Albumin: 4.8 g/dL (ref 4.3–5.2)
Alkaline Phosphatase: 66 IU/L (ref 44–121)
BUN/Creatinine Ratio: 9 (ref 9–20)
BUN: 9 mg/dL (ref 6–20)
Bilirubin Total: 0.9 mg/dL (ref 0.0–1.2)
CO2: 27 mmol/L (ref 20–29)
Calcium: 9.8 mg/dL (ref 8.7–10.2)
Chloride: 101 mmol/L (ref 96–106)
Creatinine, Ser: 1.02 mg/dL (ref 0.76–1.27)
Globulin, Total: 2.5 g/dL (ref 1.5–4.5)
Glucose: 90 mg/dL (ref 70–99)
Potassium: 3.8 mmol/L (ref 3.5–5.2)
Sodium: 140 mmol/L (ref 134–144)
Total Protein: 7.3 g/dL (ref 6.0–8.5)
eGFR: 107 mL/min/{1.73_m2} (ref >59)

## 2022-10-12 LAB — CBC
Hematocrit: 45.8 % (ref 37.5–51.0)
Hemoglobin: 15.6 g/dL (ref 13.0–17.7)
MCH: 30.8 pg (ref 26.6–33.0)
MCHC: 34.1 g/dL (ref 31.5–35.7)
MCV: 91 fL (ref 79–97)
Platelets: 185 x10E3/uL (ref 150–450)
RBC: 5.06 x10E6/uL (ref 4.14–5.80)
RDW: 11.8 % (ref 11.6–15.4)
WBC: 4.9 x10E3/uL (ref 3.4–10.8)

## 2022-10-12 LAB — LIPID PANEL
Chol/HDL Ratio: 1.9 ratio (ref 0.0–5.0)
Cholesterol, Total: 118 mg/dL (ref 100–199)
HDL: 61 mg/dL (ref >39)
LDL Chol Calc (NIH): 43 mg/dL (ref 0–99)
Triglycerides: 67 mg/dL (ref 0–149)
VLDL Cholesterol Cal: 14 mg/dL (ref 5–40)

## 2022-10-12 LAB — TSH: TSH: 1.61 u[IU]/mL (ref 0.450–4.500)

## 2022-11-23 ENCOUNTER — Telehealth: Payer: Self-pay | Admitting: Nurse Practitioner

## 2022-11-23 NOTE — Telephone Encounter (Signed)
Caller & Relationship to patient: [facility] @ [facility] in [place], [place] MRN #  [id number]   Call Back Number: [phone]  FAX: [phone]  Date of Last Office Visit: 10/11/2022     Date of Next Office Visit: Visit date not found    Medication(s) to be Refilled: Flonase and Qvar inhaler  Preferred Pharmacy: [facility] in [place], [place] ([place] preferred pharmacy)  ** Please notify patient to allow 48-72 hours to process** **Let patient know to contact pharmacy at the end of the day to make sure medication is ready. ** **If patient has not been seen in a year or longer, book an appointment **Advise to use MyChart for refill requests OR to contact their pharmacy

## 2022-11-25 ENCOUNTER — Telehealth: Payer: Self-pay | Admitting: Nurse Practitioner

## 2022-11-25 NOTE — Telephone Encounter (Signed)
Caller & Relationship to patient:  MRN #  [id number]   Call Back Number:   Date of Last Office Visit: 11/23/2022     Date of Next Office Visit: 10/12/2023   Medication(s) to be Refilled: inhaler   Preferred Pharmacy: [facility] in [place]  ** Please notify patient to allow 48-72 hours to process** **Let patient know to contact pharmacy at the end of the day to make sure medication is ready. ** **If patient has not been seen in a year or longer, book an appointment **Advise to use MyChart for refill requests OR to contact their pharmacy

## 2022-11-26 ENCOUNTER — Other Ambulatory Visit: Payer: Self-pay

## 2022-11-26 ENCOUNTER — Other Ambulatory Visit: Payer: Self-pay | Admitting: Nurse Practitioner

## 2022-11-26 DIAGNOSIS — J4530 Mild persistent asthma, uncomplicated: Secondary | ICD-10-CM

## 2022-11-26 DIAGNOSIS — J302 Other seasonal allergic rhinitis: Secondary | ICD-10-CM

## 2022-11-26 MED ORDER — FLUTICASONE PROPIONATE HFA 110 MCG/ACT IN AERO
INHALATION_SPRAY | RESPIRATORY_TRACT | 0 refills | Status: DC
  Filled 2022-11-26: qty 12, fill #0

## 2022-11-26 MED ORDER — FLUTICASONE PROPIONATE 50 MCG/ACT NA SUSP
NASAL | 0 refills | Status: DC
  Filled 2022-11-26: qty 16, 30d supply, fill #0

## 2022-11-26 NOTE — Telephone Encounter (Signed)
Done KH 

## 2022-11-30 ENCOUNTER — Other Ambulatory Visit: Payer: Self-pay | Admitting: *Deleted

## 2022-11-30 DIAGNOSIS — J4530 Mild persistent asthma, uncomplicated: Secondary | ICD-10-CM

## 2022-11-30 DIAGNOSIS — J302 Other seasonal allergic rhinitis: Secondary | ICD-10-CM

## 2022-11-30 MED ORDER — FLUTICASONE PROPIONATE 50 MCG/ACT NA SUSP: NASAL | 0 refills | Status: DC

## 2022-11-30 MED ORDER — FLUTICASONE PROPIONATE HFA 110 MCG/ACT IN AERO: INHALATION_SPRAY | RESPIRATORY_TRACT | 0 refills | Status: DC

## 2022-12-01 ENCOUNTER — Other Ambulatory Visit: Payer: Self-pay

## 2022-12-02 ENCOUNTER — Other Ambulatory Visit: Payer: Self-pay

## 2022-12-10 ENCOUNTER — Other Ambulatory Visit: Payer: Self-pay

## 2023-01-26 ENCOUNTER — Other Ambulatory Visit: Payer: Self-pay | Admitting: Nurse Practitioner

## 2023-01-26 DIAGNOSIS — J4530 Mild persistent asthma, uncomplicated: Secondary | ICD-10-CM

## 2023-01-27 NOTE — Telephone Encounter (Signed)
Please advise KH 

## 2023-10-12 ENCOUNTER — Encounter: Payer: Self-pay | Admitting: Nurse Practitioner

## 2023-10-12 ENCOUNTER — Ambulatory Visit (INDEPENDENT_AMBULATORY_CARE_PROVIDER_SITE_OTHER): Payer: Self-pay | Admitting: Nurse Practitioner

## 2023-10-12 VITALS — BP 102/74 | HR 63 | Temp 97.4°F | Resp 16 | Ht 74.0 in | Wt 143.6 lb

## 2023-10-12 DIAGNOSIS — Z23 Encounter for immunization: Secondary | ICD-10-CM

## 2023-10-12 DIAGNOSIS — J4530 Mild persistent asthma, uncomplicated: Secondary | ICD-10-CM

## 2023-10-12 DIAGNOSIS — J4520 Mild intermittent asthma, uncomplicated: Secondary | ICD-10-CM

## 2023-10-12 DIAGNOSIS — Z0000 Encounter for general adult medical examination without abnormal findings: Secondary | ICD-10-CM

## 2023-10-12 DIAGNOSIS — J302 Other seasonal allergic rhinitis: Secondary | ICD-10-CM

## 2023-10-12 MED ORDER — FLUTICASONE PROPIONATE 50 MCG/ACT NA SUSP: NASAL | 0 refills | Status: DC

## 2023-10-12 MED ORDER — FLUTICASONE PROPIONATE HFA 110 MCG/ACT IN AERO: INHALATION_SPRAY | RESPIRATORY_TRACT | 0 refills | Status: DC

## 2023-10-12 NOTE — Progress Notes (Signed)
Subjective   Patient ID: Shawn [name], male    DOB: 10-18-2001, 22 y.o.   MRN: [id number]  Chief Complaint  Patient presents with   Medical Management of Chronic Issues    Referring provider: No ref. provider found  Shawn [name] is a 22 y.o. male with Past Medical History: No date: Anxiety No date: Asthma No date: Asthma No date: Depression   HPI  Patient presents today for a physical.  He does have a history of asthma and anxiety and depression. He is not currently on any prescription medications other than inhalers.  He is requesting Flonase to be sent to the pharmacy. Denies f/c/s, n/v/d, hemoptysis, PND, leg swelling Denies chest pain or edema   Allergies  Allergen Reactions   Pollen Extract Cough    Immunization History  Administered Date(s) Administered   Influenza,inj,Quad PF,6+ Mos 10/18/2018, 12/10/2020, 10/11/2022   PFIZER Comirnaty(Gray Top)Covid-19 Tri-Sucrose Vaccine 06/08/2021   PFIZER(Purple Top)SARS-COV-2 Vaccination 06/23/2020, 07/14/2020    Tobacco History: Social History   Tobacco Use  Smoking Status Never  Smokeless Tobacco Never   Counseling given: Not Answered   Outpatient Encounter Medications as of 10/12/2023  Medication Sig   albuterol (VENTOLIN HFA) 108 (90 Base) MCG/ACT inhaler Inhale 1-2 puffs into the lungs every 6 (six) hours as needed for wheezing or shortness of breath.   [DISCONTINUED] fluticasone (FLONASE) 50 MCG/ACT nasal spray PLACE 1 SPRAY IN EACH NOSTRIL DAILY AS NEEDED FOR allergies OR RHINITIS   [DISCONTINUED] fluticasone (FLOVENT HFA) 110 MCG/ACT inhaler INHALE 1 PUFF WITH SPACER DAILY WHEN WELL, TWICE DAILY WHEN SICK   fluticasone (FLONASE) 50 MCG/ACT nasal spray PLACE 1 SPRAY IN EACH NOSTRIL DAILY AS NEEDED FOR allergies OR RHINITIS   fluticasone (FLOVENT HFA) 110 MCG/ACT inhaler inhale 1 puff with spacer daily when well, twice daily when sick   [DISCONTINUED] albuterol (VENTOLIN HFA) 108 (90 Base) MCG/ACT inhaler  Inhale 2 puffs into the lungs every 6 (six) hours as needed for wheezing or shortness of breath.   No facility-administered encounter medications on file as of 10/12/2023.    Review of Systems  Review of Systems  Constitutional: Negative.   HENT: Negative.    Cardiovascular: Negative.   Gastrointestinal: Negative.   Allergic/Immunologic: Negative.   Neurological: Negative.   Psychiatric/Behavioral: Negative.       Objective:   BP 102/74   Pulse 63   Temp (!) 97.4 F (36.3 C) (Tympanic)   Resp 16   Ht 6\' 2"  (1.88 m)   Wt 143 lb 9.6 oz (65.1 kg)   SpO2 99%   BMI 18.44 kg/m   Wt Readings from Last 5 Encounters:  10/12/23 143 lb 9.6 oz (65.1 kg)  10/11/22 140 lb (63.5 kg)  12/13/21 141 lb 1.5 oz (64 kg)  07/21/21 140 lb (63.5 kg)  03/27/21 142 lb (64.4 kg) (28%, Z= -0.58)*   * Growth percentiles are based on CDC (Boys, 2-20 Years) data.     Physical Exam Vitals and nursing note reviewed.  Constitutional:      General: He is not in acute distress.    Appearance: He is well-developed.  Cardiovascular:     Rate and Rhythm: Normal rate and regular rhythm.  Pulmonary:     Effort: Pulmonary effort is normal.     Breath sounds: Normal breath sounds.  Skin:    General: Skin is warm and dry.  Neurological:     Mental Status: He is alert and oriented to person, place, and time.  Assessment & Plan:   Routine adult health maintenance -     CBC; Future -     Comprehensive metabolic panel; Future -     Lipid panel; Future  Seasonal allergies -     Fluticasone Propionate; PLACE 1 SPRAY IN EACH NOSTRIL DAILY AS NEEDED FOR allergies OR RHINITIS  Dispense: 16 g; Refill: 0  Mild persistent asthma, uncomplicated -     Fluticasone Propionate HFA; inhale 1 puff with spacer daily when well, twice daily when sick  Dispense: 12 g; Refill: 0  Mild intermittent asthma without complication     Return in about 1 year (around 10/11/2024) for physical.   Shawn Andrew, NP 10/12/2023

## 2023-10-12 NOTE — Patient Instructions (Signed)
1. Seasonal allergies  - fluticasone (FLONASE) 50 MCG/ACT nasal spray; PLACE 1 SPRAY IN EACH NOSTRIL DAILY AS NEEDED FOR allergies OR RHINITIS  Dispense: 16 g; Refill: 0  2. Mild persistent asthma, uncomplicated  - fluticasone (FLOVENT HFA) 110 MCG/ACT inhaler; inhale 1 puff with spacer daily when well, twice daily when sick  Dispense: 12 g; Refill: 2   3. Mild intermittent asthma without complication   4. Routine adult health maintenance  - CBC; Future - Comprehensive metabolic panel; Future - Lipid Panel; Future   Follow up:  Follow up in 1 year

## 2023-10-21 ENCOUNTER — Other Ambulatory Visit: Payer: Non-veteran care

## 2023-10-21 DIAGNOSIS — Z0000 Encounter for general adult medical examination without abnormal findings: Secondary | ICD-10-CM

## 2023-10-22 LAB — COMPREHENSIVE METABOLIC PANEL
ALT: 18 [IU]/L (ref 0–44)
AST: 20 [IU]/L (ref 0–40)
Albumin: 4.6 g/dL (ref 4.3–5.2)
Alkaline Phosphatase: 72 [IU]/L (ref 44–121)
BUN/Creatinine Ratio: 10 (ref 9–20)
BUN: 12 mg/dL (ref 6–20)
Bilirubin Total: 1.2 mg/dL (ref 0.0–1.2)
CO2: 25 mmol/L (ref 20–29)
Calcium: 9.7 mg/dL (ref 8.7–10.2)
Chloride: 100 mmol/L (ref 96–106)
Creatinine, Ser: 1.2 mg/dL (ref 0.76–1.27)
Globulin, Total: 2.4 g/dL (ref 1.5–4.5)
Glucose: 89 mg/dL (ref 70–99)
Potassium: 4.1 mmol/L (ref 3.5–5.2)
Sodium: 142 mmol/L (ref 134–144)
Total Protein: 7 g/dL (ref 6.0–8.5)
eGFR: 88 mL/min/{1.73_m2} (ref >59)

## 2023-10-22 LAB — CBC
Hematocrit: 46.9 % (ref 37.5–51.0)
Hemoglobin: 15.3 g/dL (ref 13.0–17.7)
MCH: 30.2 pg (ref 26.6–33.0)
MCHC: 32.6 g/dL (ref 31.5–35.7)
MCV: 93 fL (ref 79–97)
Platelets: 184 10*3/uL (ref 150–450)
RBC: 5.07 x10E6/uL (ref 4.14–5.80)
RDW: 11.7 % (ref 11.6–15.4)
WBC: 3.8 10*3/uL (ref 3.4–10.8)

## 2023-10-22 LAB — LIPID PANEL
Chol/HDL Ratio: 2.1 ratio (ref 0.0–5.0)
Cholesterol, Total: 119 mg/dL (ref 100–199)
HDL: 57 mg/dL (ref >39)
LDL Chol Calc (NIH): 50 mg/dL (ref 0–99)
Triglycerides: 53 mg/dL (ref 0–149)
VLDL Cholesterol Cal: 12 mg/dL (ref 5–40)

## 2024-01-17 ENCOUNTER — Other Ambulatory Visit: Payer: Self-pay | Admitting: Family Medicine

## 2024-01-17 DIAGNOSIS — J4520 Mild intermittent asthma, uncomplicated: Secondary | ICD-10-CM

## 2024-01-18 ENCOUNTER — Telehealth (INDEPENDENT_AMBULATORY_CARE_PROVIDER_SITE_OTHER): Payer: Non-veteran care | Admitting: Nurse Practitioner

## 2024-01-18 ENCOUNTER — Encounter: Payer: Self-pay | Admitting: Nurse Practitioner

## 2024-01-18 DIAGNOSIS — J4530 Mild persistent asthma, uncomplicated: Secondary | ICD-10-CM

## 2024-01-18 DIAGNOSIS — J302 Other seasonal allergic rhinitis: Secondary | ICD-10-CM

## 2024-01-18 MED ORDER — FLUTICASONE PROPIONATE 50 MCG/ACT NA SUSP: NASAL | 0 refills | Status: DC

## 2024-01-18 MED ORDER — FLUTICASONE PROPIONATE HFA 110 MCG/ACT IN AERO: INHALATION_SPRAY | RESPIRATORY_TRACT | 0 refills | Status: DC

## 2024-01-18 NOTE — Progress Notes (Signed)
 Virtual Visit via Video Note  I connected with Shawn [name] on 01/18/24 at 10:20 AM EST by a video enabled telemedicine application and verified that I am speaking with the correct person using two identifiers.  Location: Patient: home Provider: office   I discussed the limitations of evaluation and management by telemedicine and the availability of in person appointments. The patient expressed understanding and agreed to proceed.  History of Present Illness:  Patient presents today for a follow-up visit.  He states that he has been doing well but has been having some increased issues with asthma due to weather change.  We will refill Flonase and Flovent. Denies f/c/s, n/v/d, hemoptysis, PND, leg swelling Denies chest pain or edema     Observations/Objective:     10/12/2023    1:04 PM 10/11/2022    2:43 PM 12/13/2021    1:28 AM  Vitals with BMI  Height 6\' 2"  6\' 2"    Weight 143 lbs 10 oz 140 lbs   BMI 18.43 17.97   Systolic 102 102 454  Diastolic 74 67 80  Pulse 63 69 70      Assessment and Plan:  1. Mild persistent asthma, uncomplicated  - fluticasone (FLOVENT HFA) 110 MCG/ACT inhaler; inhale 1 puff with spacer daily when well, twice daily when sick  Dispense: 12 g; Refill: 0  2. Seasonal allergies  - fluticasone (FLONASE) 50 MCG/ACT nasal spray; PLACE 1 SPRAY IN EACH NOSTRIL DAILY AS NEEDED FOR allergies OR RHINITIS  Dispense: 16 g; Refill: 0   Follow up:  Follow up as needed    I discussed the assessment and treatment plan with the patient. The patient was provided an opportunity to ask questions and all were answered. The patient agreed with the plan and demonstrated an understanding of the instructions.   The patient was advised to call back or seek an in-person evaluation if the symptoms worsen or if the condition fails to improve as anticipated.  I provided 23 minutes of non-face-to-face time during this encounter.   Ivonne Andrew, NP

## 2024-01-18 NOTE — Patient Instructions (Signed)
 1. Mild persistent asthma, uncomplicated  - fluticasone (FLOVENT HFA) 110 MCG/ACT inhaler; inhale 1 puff with spacer daily when well, twice daily when sick  Dispense: 12 g; Refill: 0  2. Seasonal allergies  - fluticasone (FLONASE) 50 MCG/ACT nasal spray; PLACE 1 SPRAY IN EACH NOSTRIL DAILY AS NEEDED FOR allergies OR RHINITIS  Dispense: 16 g; Refill: 0   Follow up:  Follow up as needed

## 2024-02-19 ENCOUNTER — Other Ambulatory Visit: Payer: Self-pay

## 2024-02-19 ENCOUNTER — Emergency Department (HOSPITAL_COMMUNITY)
Admission: EM | Admit: 2024-02-19 | Discharge: 2024-02-20 | Disposition: A | Discharge status: Home / Self Care | Payer: Self-pay | Attending: Emergency Medicine | Admitting: Emergency Medicine

## 2024-02-19 ENCOUNTER — Encounter (HOSPITAL_COMMUNITY): Payer: Self-pay | Admitting: *Deleted

## 2024-02-19 DIAGNOSIS — J45909 Unspecified asthma, uncomplicated: Secondary | ICD-10-CM | POA: Insufficient documentation

## 2024-02-19 DIAGNOSIS — U071 COVID-19: Secondary | ICD-10-CM

## 2024-02-19 LAB — CBC
HCT: 48.5 % (ref 39.0–52.0)
Hemoglobin: 16.4 g/dL (ref 13.0–17.0)
MCH: 30.8 pg (ref 26.0–34.0)
MCHC: 33.8 g/dL (ref 30.0–36.0)
MCV: 91.2 fL (ref 80.0–100.0)
Platelets: 165 10*3/uL (ref 150–400)
RBC: 5.32 MIL/uL (ref 4.22–5.81)
RDW: 12.1 % (ref 11.5–15.5)
WBC: 5 10*3/uL (ref 4.0–10.5)
nRBC: 0 % (ref 0.0–0.2)

## 2024-02-19 LAB — COMPREHENSIVE METABOLIC PANEL
ALT: 15 U/L (ref 0–44)
AST: 20 U/L (ref 15–41)
Albumin: 4.4 g/dL (ref 3.5–5.0)
Alkaline Phosphatase: 56 U/L (ref 38–126)
Anion gap: 12 (ref 5–15)
BUN: 10 mg/dL (ref 6–20)
CO2: 26 mmol/L (ref 22–32)
Calcium: 9.7 mg/dL (ref 8.9–10.3)
Chloride: 99 mmol/L (ref 98–111)
Creatinine, Ser: 1.12 mg/dL (ref 0.61–1.24)
GFR, Estimated: >60 mL/min (ref >60)
Glucose, Bld: 113 mg/dL — ABNORMAL HIGH (ref 70–99)
Potassium: 4.1 mmol/L (ref 3.5–5.1)
Sodium: 137 mmol/L (ref 135–145)
Total Bilirubin: 1 mg/dL (ref 0.0–1.2)
Total Protein: 7.8 g/dL (ref 6.5–8.1)

## 2024-02-19 LAB — URINALYSIS, ROUTINE W REFLEX MICROSCOPIC
Bilirubin Urine: NEGATIVE
Glucose, UA: NEGATIVE mg/dL
Ketones, ur: 40 mg/dL — AB
Leukocytes,Ua: NEGATIVE
Nitrite: NEGATIVE
Protein, ur: NEGATIVE mg/dL
Specific Gravity, Urine: 1.02 (ref 1.005–1.030)
pH: 6 (ref 5.0–8.0)

## 2024-02-19 LAB — RESP PANEL BY RT-PCR (RSV, FLU A&B, COVID)  RVPGX2
Influenza A by PCR: NEGATIVE
Influenza B by PCR: NEGATIVE
Resp Syncytial Virus by PCR: NEGATIVE
SARS Coronavirus 2 by RT PCR: POSITIVE — AB

## 2024-02-19 LAB — URINALYSIS, MICROSCOPIC (REFLEX)

## 2024-02-19 MED ORDER — ONDANSETRON 4 MG PO TBDP
4.0000 mg | ORAL_TABLET | Freq: Once | ORAL | Status: AC | PRN
  Administered 2024-02-19: 4 mg via ORAL
  Filled 2024-02-19: qty 1

## 2024-02-19 NOTE — ED Triage Notes (Signed)
 Fever headache nausea and vomiting since friday

## 2024-02-20 MED ORDER — ONDANSETRON 4 MG PO TBDP
4.0000 mg | ORAL_TABLET | Freq: Once | ORAL | Status: AC
  Administered 2024-02-20: 4 mg via ORAL
  Filled 2024-02-20: qty 1

## 2024-02-20 MED ORDER — ONDANSETRON 4 MG PO TBDP: 4.0000 mg | ORAL_TABLET | Freq: Three times a day (TID) | ORAL | 0 refills | Status: DC | PRN

## 2024-02-20 MED ORDER — KETOROLAC TROMETHAMINE 30 MG/ML IJ SOLN
30.0000 mg | Freq: Once | INTRAMUSCULAR | Status: AC
  Administered 2024-02-20: 30 mg via INTRAMUSCULAR
  Filled 2024-02-20: qty 1

## 2024-02-20 NOTE — Discharge Instructions (Signed)
 You were seen today for viral illness.  You tested positive for COVID-19.  This is likely the culprit.  Take Zofran as needed for ongoing nausea.  Make sure that you are staying hydrated.  You may take Tylenol or ibuprofen for any fevers or headache.

## 2024-02-20 NOTE — ED Provider Notes (Signed)
 [place] EMERGENCY DEPARTMENT AT [facility] Provider Note   CSN: [id number] Arrival date & time: 02/19/24  2028     History  Chief Complaint  Patient presents with   Fever    Shawn [name] is a 23 y.o. male.  HPI     This is a 23 year old male who presents with fever, headache, nausea and vomiting.  Onset of symptoms around Friday.  Patient reports fevers up to 102 at home.  Took Tylenol last night.  Has had ongoing nausea and vomiting.  No abdominal pain.  Has also had some diarrhea.  No known sick contacts.  He is also endorsing a headache.  No neck stiffness.  Home Medications Prior to Admission medications   Medication Sig Start Date End Date Taking? Authorizing Provider  ondansetron (ZOFRAN-ODT) 4 MG disintegrating tablet Take 1 tablet (4 mg total) by mouth every 8 (eight) hours as needed. 02/20/24  Yes Fabianna Keats, Mayer Masker, MD  albuterol (VENTOLIN HFA) 108 (90 Base) MCG/ACT inhaler INHALE 2 PUFFS INTO THE LUNGS EVERY 6 HOURS AS NEEDED FOR WHEEZING OR SHORTNESS OF BREATH 01/18/24   Ivonne Andrew, NP  fluticasone (FLONASE) 50 MCG/ACT nasal spray PLACE 1 SPRAY IN EACH NOSTRIL DAILY AS NEEDED FOR allergies OR RHINITIS 01/18/24   Ivonne Andrew, NP  fluticasone (FLOVENT HFA) 110 MCG/ACT inhaler inhale 1 puff with spacer daily when well, twice daily when sick 01/18/24   Ivonne Andrew, NP      Allergies    Pollen extract    Review of Systems   Review of Systems  Constitutional:  Positive for fever.  Respiratory:  Negative for shortness of breath.   Cardiovascular:  Negative for chest pain.  Gastrointestinal:  Positive for nausea and vomiting.  Neurological:  Positive for headaches.  All other systems reviewed and are negative.   Physical Exam Updated Vital Signs BP 124/80 (BP Location: Right Arm)   Pulse 91   Temp 100 F (37.8 C)   Resp 16   Ht 1.88 m (6\' 2" )   Wt 65.1 kg   SpO2 96%   BMI 18.43 kg/m  Physical Exam Vitals and nursing note  reviewed.  Constitutional:      Appearance: He is well-developed. He is not ill-appearing.  HENT:     Head: Normocephalic and atraumatic.     Mouth/Throat:     Mouth: Mucous membranes are dry.  Eyes:     Pupils: Pupils are equal, round, and reactive to light.  Neck:     Comments: No meningismus Cardiovascular:     Rate and Rhythm: Normal rate and regular rhythm.     Heart sounds: Normal heart sounds. No murmur heard. Pulmonary:     Effort: Pulmonary effort is normal. No respiratory distress.     Breath sounds: Normal breath sounds. No wheezing.  Abdominal:     Palpations: Abdomen is soft.     Tenderness: There is no abdominal tenderness. There is no rebound.  Musculoskeletal:     Cervical back: Normal range of motion and neck supple.  Lymphadenopathy:     Cervical: No cervical adenopathy.  Skin:    General: Skin is warm and dry.  Neurological:     Mental Status: He is alert and oriented to person, place, and time.  Psychiatric:        Mood and Affect: Mood normal.     ED Results / Procedures / Treatments   Labs (all labs ordered are listed, but only abnormal results  are displayed) Labs Reviewed  RESP PANEL BY RT-PCR (RSV, FLU A&B, COVID)  RVPGX2 - Abnormal; Notable for the following components:      Result Value   SARS Coronavirus 2 by RT PCR POSITIVE (*)    All other components within normal limits  COMPREHENSIVE METABOLIC PANEL - Abnormal; Notable for the following components:   Glucose, Bld 113 (*)    All other components within normal limits  URINALYSIS, ROUTINE W REFLEX MICROSCOPIC - Abnormal; Notable for the following components:   Hgb urine dipstick TRACE (*)    Ketones, ur 40 (*)    All other components within normal limits  URINALYSIS, MICROSCOPIC (REFLEX) - Abnormal; Notable for the following components:   Bacteria, UA RARE (*)    All other components within normal limits  CBC    EKG None  Radiology No results found.  Procedures Procedures     Medications Ordered in ED Medications  ondansetron (ZOFRAN-ODT) disintegrating tablet 4 mg (4 mg Oral Given 02/19/24 2100)  ondansetron (ZOFRAN-ODT) disintegrating tablet 4 mg (4 mg Oral Given 02/20/24 0033)  ketorolac (TORADOL) 30 MG/ML injection 30 mg (30 mg Intramuscular Given 02/20/24 0034)    ED Course/ Medical Decision Making/ A&P                                 Medical Decision Making Risk Prescription drug management.   This patient presents to the ED for concern of vomiting, diarrhea, fever, this involves an extensive number of treatment options, and is a complaint that carries with it a high risk of complications and morbidity.  I considered the following differential and admission for this acute, potentially life threatening condition.  The differential diagnosis includes viral illness such as COVID or influenza, norovirus, less likely SBO or meningitis  MDM:    This is a 23 year old male who presents with vomiting and diarrhea.  Febrile at home as well.  No known sick contacts.  Vital signs are largely reassuring.  He is in no distress.  Basic lab work is largely unremarkable.  Viral testing notable for positive COVID.  Patient was informed of these results.  He did receive initial primary vaccine series.  Recommend supportive measures at home.  Will discharge with Zofran.  Patient was able to orally hydrate after Zofran and Toradol in the emergency department.  May return to school after 24 hours fever free.  (Labs, imaging, consults)  Labs: I Ordered, and personally interpreted labs.  The pertinent results include: CBC, CMP, urinalysis  Imaging Studies ordered: I ordered imaging studies including none I independently visualized and interpreted imaging. I agree with the radiologist interpretation  Additional history obtained from chart review and mother at bedside.  External records from outside source obtained and reviewed including prior evaluations  Cardiac  Monitoring: The patient was maintained on a cardiac monitor.  If on the cardiac monitor, I personally viewed and interpreted the cardiac monitored which showed an underlying rhythm of: Sinus  Reevaluation: After the interventions noted above, I reevaluated the patient and found that they have :improved  Social Determinants of Health:  in school  Disposition: Discharge  Co morbidities that complicate the patient evaluation  Past Medical History:  Diagnosis Date   Anxiety    Asthma    Asthma    Depression      Medicines Meds ordered this encounter  Medications   ondansetron (ZOFRAN-ODT) disintegrating tablet 4 mg  ondansetron (ZOFRAN-ODT) disintegrating tablet 4 mg   ketorolac (TORADOL) 30 MG/ML injection 30 mg   ondansetron (ZOFRAN-ODT) 4 MG disintegrating tablet    Sig: Take 1 tablet (4 mg total) by mouth every 8 (eight) hours as needed.    Dispense:  20 tablet    Refill:  0    I have reviewed the patients home medicines and have made adjustments as needed  Problem List / ED Course: Problem List Items Addressed This Visit   None Visit Diagnoses       COVID-19    -  Primary                   Final Clinical Impression(s) / ED Diagnoses Final diagnoses:  COVID-19    Rx / DC Orders ED Discharge Orders          Ordered    ondansetron (ZOFRAN-ODT) 4 MG disintegrating tablet  Every 8 hours PRN        02/20/24 0121              Shon Baton, MD 02/20/24 [phone]

## 2024-02-21 ENCOUNTER — Telehealth: Payer: Self-pay

## 2024-02-21 NOTE — Transitions of Care (Post Inpatient/ED Visit) (Signed)
   02/21/2024  Name: Shawn [name] MRN: [id number] DOB: 01-13-2001  Today's TOC FU Call Status: Today's TOC FU Call Status:: Unsuccessful Call (1st Attempt) Unsuccessful Call (1st Attempt) Date: 02/21/24  Attempted to reach the patient regarding the most recent Inpatient/ED visit.  Follow Up Plan: Additional outreach attempts will be made to reach the patient to complete the Transitions of Care (Post Inpatient/ED visit) call.   Signature  [employer], [place]

## 2024-02-23 ENCOUNTER — Telehealth: Payer: Self-pay

## 2024-02-23 NOTE — Transitions of Care (Post Inpatient/ED Visit) (Signed)
   02/23/2024  Name: Manoah Deckard MRN: [id number] DOB: [birth year]  Today's TOC FU Call Status: Today's TOC FU Call Status:: Unsuccessful Call (2nd Attempt) Unsuccessful Call (2nd Attempt) Date: 02/23/24  Attempted to reach the patient regarding the most recent Inpatient/ED visit.  Follow Up Plan: Additional outreach attempts will be made to reach the patient to complete the Transitions of Care (Post Inpatient/ED visit) call.   Signature  [employer], [place]

## 2024-10-12 ENCOUNTER — Ambulatory Visit: Payer: Self-pay | Admitting: Nurse Practitioner

## 2024-11-12 ENCOUNTER — Ambulatory Visit: Payer: Self-pay

## 2024-11-12 ENCOUNTER — Other Ambulatory Visit: Payer: Self-pay

## 2024-11-12 ENCOUNTER — Telehealth: Payer: Self-pay

## 2024-11-12 ENCOUNTER — Ambulatory Visit: Payer: Self-pay | Admitting: Nurse Practitioner

## 2024-11-12 VITALS — BP 121/65 | HR 66 | Wt 139.2 lb

## 2024-11-12 DIAGNOSIS — Z1329 Encounter for screening for other suspected endocrine disorder: Secondary | ICD-10-CM

## 2024-11-12 DIAGNOSIS — L709 Acne, unspecified: Secondary | ICD-10-CM

## 2024-11-12 DIAGNOSIS — Z23 Encounter for immunization: Secondary | ICD-10-CM

## 2024-11-12 DIAGNOSIS — J4520 Mild intermittent asthma, uncomplicated: Secondary | ICD-10-CM

## 2024-11-12 DIAGNOSIS — Z13220 Encounter for screening for lipoid disorders: Secondary | ICD-10-CM

## 2024-11-12 DIAGNOSIS — Z114 Encounter for screening for human immunodeficiency virus [HIV]: Secondary | ICD-10-CM

## 2024-11-12 DIAGNOSIS — Z1159 Encounter for screening for other viral diseases: Secondary | ICD-10-CM

## 2024-11-12 MED ORDER — BUDESONIDE-FORMOTEROL FUMARATE 160-4.5 MCG/ACT IN AERO: 1.0000 | INHALATION_SPRAY | Freq: Two times a day (BID) | RESPIRATORY_TRACT | 5 refills | Status: AC

## 2024-11-12 MED ORDER — ALBUTEROL SULFATE HFA 108 (90 BASE) MCG/ACT IN AERS: 2.0000 | INHALATION_SPRAY | Freq: Four times a day (QID) | RESPIRATORY_TRACT | 0 refills | Status: AC | PRN

## 2024-11-12 MED ORDER — MONTELUKAST SODIUM 10 MG PO TABS: 10.0000 mg | ORAL_TABLET | Freq: Every day | ORAL | 3 refills | Status: AC

## 2024-11-12 MED ORDER — FLUTICASONE-SALMETEROL 115-21 MCG/ACT IN AERO: 2.0000 | INHALATION_SPRAY | Freq: Two times a day (BID) | RESPIRATORY_TRACT | 12 refills | Status: DC

## 2024-11-12 NOTE — Patient Instructions (Signed)
     Dental Resources  [facility]   [address]   Cleaning: $5 [place], [place] [address]  Xray: $5 [phone]  Call to get on waiting list   Dr. Royston Cornea McMasters/ Dr. Bonnielee Buttery [address]    Xray: $85 each [place], [place] [address]  Extraction $200 [phone]   Dr. Lovena Rubinstein Turner/ Dr. Glorine Laroche  [address]    Exam, Cleaning, Xray: $262 [place]   Extraction: $324-$401 [phone]  Dr. Michiel Ala   [address]   Extraction: $300 per tooth  [place] [address]  [phone]   Dr. Crissie Dome  [address]    Cleaning: $300  [place], [place] [address]  Extraction: [phone]  [place]   Dr. Alessandra Ancona  [address]    Exam: $85 [place], [place]    Extraction: $120 and up  [phone]    *full list of prices available*  [facility] [address]  Exam: $94 [place], [place]    Exam with Xrays: $380  [phone]    Xrays: $68 and up       Cleaning: $101       Extraction $190 and up  [facility]    [address]    Cleaning + Xray: $344 [place]    Extraction: pt must be seen first for price [phone]  [facility] [address]   Emergency Exam: $65 [place] [address]   Cleaning & Exam: $150  [phone]    Extractions: simple $180; surgical $250      Fillings: [id number]

## 2024-11-12 NOTE — Progress Notes (Signed)
 Subjective   Patient ID: Shawn [name], male    DOB: [birth year], 23 y.o.   MRN: [id number]  Chief Complaint  Patient presents with   Asthma Action Plan   Labs Only   Medication Refill    Refill on all medications except zofran  , Is there an alternative to the flovent , it was expensive for the patient     Referring provider: Oley Shawn RAMAN, NP  Shawn [name] is a 23 y.o. male with Past Medical History: No date: Anxiety No date: Asthma No date: Asthma No date: Depression   HPI  Patient presents today for follow-up on asthma.  Overall he is stable at this time.  He does need a refill on albuterol .  Patient would like a maintenance inhaler but does not currently have insurance.  We we will ask pharmacy about this.  We will check labs today.  Patient also needs a refill on Zofran  to take as needed.  Patient is requesting a referral to dermatology for acne to his back and arms. Denies f/c/s, n/v/d, hemoptysis, PND, leg swelling Denies chest pain or edema     Allergies[1]  Immunization History  Administered Date(s) Administered   Influenza, Seasonal, Injecte, Preservative Fre 10/12/2023, 11/12/2024   Influenza,inj,Quad PF,6+ Mos 10/18/2018, 12/10/2020, 10/11/2022   PFIZER Comirnaty (Gray Top)Covid-19 Tri-Sucrose Vaccine 06/08/2021   PFIZER(Purple Top)SARS-COV-2 Vaccination 06/23/2020, 07/14/2020    Tobacco History: Tobacco Use History[2] Counseling given: Not Answered   Outpatient Encounter Medications as of 11/12/2024  Medication Sig   fluticasone  (FLONASE ) 50 MCG/ACT nasal spray PLACE 1 SPRAY IN EACH NOSTRIL DAILY AS NEEDED FOR allergies OR RHINITIS   fluticasone  (FLOVENT  HFA) 110 MCG/ACT inhaler inhale 1 puff with spacer daily when well, twice daily when sick   fluticasone -salmeterol (ADVAIR HFA) 115-21 MCG/ACT inhaler Inhale 2 puffs into the lungs 2 (two) times daily.   montelukast  (SINGULAIR ) 10 MG tablet Take 1 tablet (10 mg total) by mouth at bedtime.    [DISCONTINUED] albuterol  (VENTOLIN  HFA) 108 (90 Base) MCG/ACT inhaler INHALE 2 PUFFS INTO THE LUNGS EVERY 6 HOURS AS NEEDED FOR WHEEZING OR SHORTNESS OF BREATH   [DISCONTINUED] ondansetron  (ZOFRAN -ODT) 4 MG disintegrating tablet Take 1 tablet (4 mg total) by mouth every 8 (eight) hours as needed.   albuterol  (VENTOLIN  HFA) 108 (90 Base) MCG/ACT inhaler Inhale 2 puffs into the lungs every 6 (six) hours as needed for wheezing or shortness of breath.   No facility-administered encounter medications on file as of 11/12/2024.    Review of Systems  Review of Systems  Constitutional: Negative.   HENT: Negative.    Cardiovascular: Negative.   Gastrointestinal: Negative.   Allergic/Immunologic: Negative.   Neurological: Negative.   Psychiatric/Behavioral: Negative.       Objective:   BP 121/65 (BP Location: Right Arm, Patient Position: Sitting, Cuff Size: Large)   Pulse 66   Wt 139 lb 3.2 oz (63.1 kg)   SpO2 98%   BMI 17.87 kg/m   Wt Readings from Last 5 Encounters:  11/12/24 139 lb 3.2 oz (63.1 kg)  02/19/24 143 lb 8.3 oz (65.1 kg)  10/12/23 143 lb 9.6 oz (65.1 kg)  10/11/22 140 lb (63.5 kg)  12/13/21 141 lb 1.5 oz (64 kg)     Physical Exam Vitals and nursing note reviewed.  Constitutional:      General: He is not in acute distress.    Appearance: He is well-developed.  Cardiovascular:     Rate and Rhythm: Normal rate and regular rhythm.  Pulmonary:  Effort: Pulmonary effort is normal.     Breath sounds: Normal breath sounds.  Skin:    General: Skin is warm and dry.  Neurological:     Mental Status: He is alert and oriented to person, place, and time.       Assessment & Plan:   Needs flu shot -     Flu vaccine trivalent PF, 6mos and older(Flulaval,Afluria,Fluarix,Fluzone)  Mild intermittent asthma without complication -     Montelukast  Sodium; Take 1 tablet (10 mg total) by mouth at bedtime.  Dispense: 30 tablet; Refill: 3 -     Albuterol  Sulfate HFA; Inhale  2 puffs into the lungs every 6 (six) hours as needed for wheezing or shortness of breath.  Dispense: 6.7 g; Refill: 0 -     CBC -     Comprehensive metabolic panel with GFR -     TSH -     Lipid panel  Acne, unspecified acne type -     Ambulatory referral to Dermatology  Thyroid disorder screen -     TSH  Lipid screening -     Lipid panel  Encounter for screening for HIV -     HIV Antibody (routine testing w rflx)  Need for hepatitis C screening test -     Hepatitis C antibody  Other orders -     Fluticasone -Salmeterol; Inhale 2 puffs into the lungs 2 (two) times daily.  Dispense: 1 each; Refill: 12     Return in about 3 months (around 02/10/2025).     Shawn GORMAN Borer, NP 11/12/2024     [1]  Allergies Allergen Reactions   Pollen Extract Cough  [2]  Social History Tobacco Use  Smoking Status Never  Smokeless Tobacco Never

## 2024-11-12 NOTE — Progress Notes (Signed)
 Patient was seen by PCP today. Reports he no longer has insurance coverage. He was requesting a maintenance inhaler for asthma. He denied having frequent asthma symptoms, but reports he tends to have more exacerbations in the winter.   Patient was previously treated with fluticasone  HFA 110 mcg/act + albuterol  as needed. Per [facility] recommendations, and coupon card availability, would prefer to switch patient to Symbicort  as a maintenance and reliever inhaler. This would be $35 per month using uninsured coupon (included below), so should reduce his total monthly cost and reduce risk of exacerbations with use of albuterol  alone to treat symptoms.   Attempted to contact patient to explain plan without success. Will message via MyChart.   Called the pharmacy to clarify that they should only fill Symbicort  using the $35 coupon and do not need to fill other inhalers that were sent today.    Lorain Baseman, PharmD [facility] [phone]

## 2024-11-13 ENCOUNTER — Telehealth: Payer: Self-pay | Admitting: Nurse Practitioner

## 2024-11-13 ENCOUNTER — Other Ambulatory Visit: Payer: Self-pay

## 2024-11-13 LAB — COMPREHENSIVE METABOLIC PANEL WITH GFR
ALT: 14 IU/L (ref 0–44)
AST: 17 IU/L (ref 0–40)
Albumin: 4.6 g/dL (ref 4.3–5.2)
Alkaline Phosphatase: 65 IU/L (ref 47–123)
BUN/Creatinine Ratio: 12 (ref 9–20)
BUN: 12 mg/dL (ref 6–20)
Bilirubin Total: 1 mg/dL (ref 0.0–1.2)
CO2: 24 mmol/L (ref 20–29)
Calcium: 9.7 mg/dL (ref 8.7–10.2)
Chloride: 103 mmol/L (ref 96–106)
Creatinine, Ser: 1.04 mg/dL (ref 0.76–1.27)
Globulin, Total: 2.5 g/dL (ref 1.5–4.5)
Glucose: 84 mg/dL (ref 70–99)
Potassium: 4.4 mmol/L (ref 3.5–5.2)
Sodium: 139 mmol/L (ref 134–144)
Total Protein: 7.1 g/dL (ref 6.0–8.5)
eGFR: 103 mL/min/1.73 (ref >59)

## 2024-11-13 LAB — LIPID PANEL
Chol/HDL Ratio: 2 ratio (ref 0.0–5.0)
Cholesterol, Total: 117 mg/dL (ref 100–199)
HDL: 58 mg/dL (ref >39)
LDL Chol Calc (NIH): 47 mg/dL (ref 0–99)
Triglycerides: 50 mg/dL (ref 0–149)
VLDL Cholesterol Cal: 12 mg/dL (ref 5–40)

## 2024-11-13 LAB — HIV ANTIBODY (ROUTINE TESTING W REFLEX): HIV Screen 4th Generation wRfx: NONREACTIVE

## 2024-11-13 LAB — CBC
Hematocrit: 46.6 % (ref 37.5–51.0)
Hemoglobin: 15.2 g/dL (ref 13.0–17.7)
MCH: 30.2 pg (ref 26.6–33.0)
MCHC: 32.6 g/dL (ref 31.5–35.7)
MCV: 93 fL (ref 79–97)
Platelets: 197 x10E3/uL (ref 150–450)
RBC: 5.04 x10E6/uL (ref 4.14–5.80)
RDW: 11.9 % (ref 11.6–15.4)
WBC: 3.9 x10E3/uL (ref 3.4–10.8)

## 2024-11-13 LAB — TSH: TSH: 0.678 u[IU]/mL (ref 0.450–4.500)

## 2024-11-13 LAB — HEPATITIS C ANTIBODY: Hep C Virus Ab: NONREACTIVE

## 2024-11-13 NOTE — Telephone Encounter (Signed)
 Patient's mother [name] returned my call. Explained switch to Symbicort  and directions for use explained in prior documentation. Confirmed $35 cost is ok. All questions answered.   Patient's mother did mention that pt will need a referral to pulmonology for [employer] recruitment. Will let PCP know. Also advised her to call the clinic.   Lorain Baseman, PharmD [facility] [phone]

## 2024-11-13 NOTE — Telephone Encounter (Signed)
 Lalli,Maria (Non-Patient)   Subject  [name], Shawn (Patient)   Topic  Referral - Request for Referral    Communication  Did the patient discuss referral with their provider in the last year? 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            Appointment offered? Yes        Type of order/referral and detailed reason for visit: Pulmonology        Preference of office, provider, location: [place] insurance        If referral order, have you been seen by this specialty before? No      Can we respond through MyChart? Yes

## 2024-11-14 ENCOUNTER — Ambulatory Visit: Payer: Self-pay | Admitting: Nurse Practitioner

## 2024-11-15 ENCOUNTER — Other Ambulatory Visit: Payer: Self-pay | Admitting: Nurse Practitioner

## 2024-11-15 DIAGNOSIS — J4520 Mild intermittent asthma, uncomplicated: Secondary | ICD-10-CM

## 2024-11-15 NOTE — Telephone Encounter (Signed)
 Lvm for pt to advised he will need to have [facility] to give him prior auth

## 2024-11-30 ENCOUNTER — Other Ambulatory Visit: Payer: Self-pay | Admitting: Nurse Practitioner

## 2024-11-30 DIAGNOSIS — J302 Other seasonal allergic rhinitis: Secondary | ICD-10-CM

## 2024-12-03 NOTE — Telephone Encounter (Signed)
 fluticasone  (FLONASE ) 50 MCG/ACT nasal spray [Pharmacy Med Name: fluticasone  propionate 50 mcg/actuation nasal spray,suspension]

## 2025-02-11 ENCOUNTER — Ambulatory Visit: Payer: Self-pay | Admitting: Nurse Practitioner
